# Patient Record
Sex: Female | Born: 1975 | Race: White | Hispanic: No | Marital: Married | Smoking: Never smoker
Health system: Southern US, Community
[De-identification: ages and names within clinical notes are randomized; demographics above are authoritative.]

## PROBLEM LIST (undated history)

## (undated) DIAGNOSIS — E041 Nontoxic single thyroid nodule: Secondary | ICD-10-CM

## (undated) DIAGNOSIS — E785 Hyperlipidemia, unspecified: Secondary | ICD-10-CM

## (undated) DIAGNOSIS — Z86718 Personal history of other venous thrombosis and embolism: Secondary | ICD-10-CM

## (undated) HISTORY — DX: Hyperlipidemia, unspecified: E78.5

## (undated) HISTORY — DX: Personal history of other venous thrombosis and embolism: Z86.718

## (undated) HISTORY — PX: WISDOM TOOTH EXTRACTION: SHX21

## (undated) HISTORY — DX: Nontoxic single thyroid nodule: E04.1

## (undated) HISTORY — PX: KNEE SURGERY: SHX244

---

## 2010-01-01 ENCOUNTER — Inpatient Hospital Stay (HOSPITAL_COMMUNITY): Admission: AD | Admit: 2010-01-01 | Discharge: 2010-01-03 | Payer: Self-pay | Admitting: Obstetrics and Gynecology

## 2011-03-02 LAB — CBC
MCHC: 33.6 g/dL (ref 30.0–36.0)
MCHC: 34.5 g/dL (ref 30.0–36.0)
MCV: 91.5 fL (ref 78.0–100.0)
RBC: 3.78 MIL/uL — ABNORMAL LOW (ref 3.87–5.11)
RBC: 4.32 MIL/uL (ref 3.87–5.11)
WBC: 11.1 10*3/uL — ABNORMAL HIGH (ref 4.0–10.5)

## 2011-03-02 LAB — RPR: RPR Ser Ql: NONREACTIVE

## 2011-03-02 LAB — RH IMMUNE GLOB WKUP(>/=20WKS)(NOT WOMEN'S HOSP)

## 2016-02-26 ENCOUNTER — Telehealth: Payer: Self-pay

## 2016-02-26 NOTE — Telephone Encounter (Signed)
Pre Visit call made to patient. Left message for call back.  

## 2016-02-27 ENCOUNTER — Ambulatory Visit (INDEPENDENT_AMBULATORY_CARE_PROVIDER_SITE_OTHER): Payer: BLUE CROSS/BLUE SHIELD | Admitting: Family Medicine

## 2016-02-27 ENCOUNTER — Encounter: Payer: Self-pay | Admitting: Family Medicine

## 2016-02-27 VITALS — BP 100/62 | HR 60 | Temp 98.1°F | Ht 66.0 in | Wt 131.2 lb

## 2016-02-27 DIAGNOSIS — H9193 Unspecified hearing loss, bilateral: Secondary | ICD-10-CM | POA: Diagnosis not present

## 2016-02-27 NOTE — Patient Instructions (Signed)
I will set you up to see an ENT doctor to further evaluate your ears and your hearing.   Let me know if you do not hear about your appointment

## 2016-02-27 NOTE — Progress Notes (Signed)
[facility] [place] [address], [address] [place], [place] [phone] [phone]  Date:  02/27/2016   Name:  Karen AmericanKatrina Cundy   DOB:  12/13/1976   MRN:  [id number]  PCP:  No primary care provider on file.    Chief Complaint: Ear Fullness   History of Present Illness:  Karen AmericanKatrina Ohanian is a 40 y.o. very pleasant female patient who presents with the following:  Here today as a new patient to establish care and discuss a concern- She has noted decreased hearing in both of her ears.  No buzzing or ringing.  She has a harder time with the TV; she will notice that her husband and children are watching a show and can hear, but she will not hear anything.  She thinks this has been present for 6 months perhaps. Not getting worse that she can tell.    She had swimmers ear as a child, but never did have tubes in her ears or any significant OM  She does use qtips to clean her ears.   No ear trauma She is not exposed to much loud noise, does not attend concerts or use headphones She is otherwise feeling well and generally healthy   There are no active problems to display for this patient.   Past Medical History  Diagnosis Date  . Hyperlipidemia     Past Surgical History  Procedure Laterality Date  . Knee surgery      Right  . Wisdom tooth extraction      Social History  Substance Use Topics  . Smoking status: Never Smoker   . Smokeless tobacco: Never Used  . Alcohol Use: 0.0 oz/week    0 Standard drinks or equivalent per week    Family History  Problem Relation Age of Onset  . Hyperlipidemia Mother   . Hypertension Mother   . Hypertension Father   . Hyperlipidemia Father   . Cancer Father   . Cancer Maternal Aunt   . Cancer Maternal Grandfather     No Known Allergies  Medication list has been reviewed and updated.  No current outpatient prescriptions on file prior to visit.   No current facility-administered medications  on file prior to visit.    Review of Systems:  As per HPI- otherwise negative.   Physical Examination: Filed Vitals:   02/27/16 1411  BP: 100/62  Pulse: 60  Temp: 98.1 F (36.7 C)   Filed Vitals:   02/27/16 1411  Height: 5\' 6"  (1.676 m)  Weight: 131 lb 3.2 oz (59.512 kg)   Body mass index is 21.19 kg/(m^2). Ideal Body Weight: Weight in (lb) to have BMI = 25: 154.6  GEN: WDWN, NAD, Non-toxic, A & O x 3. Slim, looks well HEENT: Atraumatic, Normocephalic. Neck supple. No masses, No LAD. Ears and Nose: No external deformity. CV: RRR, No M/G/R. No JVD. No thrill. No extra heart sounds. PULM: CTA B, no wheezes, crackles, rhonchi. No retractions. No resp. distress. No accessory muscle use. EXTR: No c/c/e NEURO Normal gait.  PSYCH: Normally interactive. Conversant. Not depressed or anxious appearing.  Calm demeanor.  Both TM and ear canals are normal with no evidence of cerumen or any other physical blockage.    Assessment and Plan: Hearing loss, bilateral - Plan: Ambulatory referral to ENT  Hearing loss for about 6 months. No obviously reversible cause such as cerumen.  Will refer to ENT for further evaluation   Signed Shanda BumpsJessica Trig Mcbryar,  MD   

## 2016-04-01 DIAGNOSIS — H903 Sensorineural hearing loss, bilateral: Secondary | ICD-10-CM | POA: Diagnosis not present

## 2016-04-01 DIAGNOSIS — H9193 Unspecified hearing loss, bilateral: Secondary | ICD-10-CM | POA: Insufficient documentation

## 2016-04-01 DIAGNOSIS — H93293 Other abnormal auditory perceptions, bilateral: Secondary | ICD-10-CM | POA: Insufficient documentation

## 2016-04-07 ENCOUNTER — Encounter: Payer: Self-pay | Admitting: Family Medicine

## 2016-04-07 DIAGNOSIS — H905 Unspecified sensorineural hearing loss: Secondary | ICD-10-CM | POA: Insufficient documentation

## 2016-08-07 ENCOUNTER — Encounter: Payer: Self-pay | Admitting: Family Medicine

## 2017-07-23 ENCOUNTER — Encounter: Payer: Self-pay | Admitting: Family Medicine

## 2017-08-02 ENCOUNTER — Telehealth: Payer: Self-pay | Admitting: Family Medicine

## 2017-08-02 NOTE — Telephone Encounter (Signed)
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  She is a bit concerned as her cholesterol numbers are elevated and had contacted me on my personal phone  Total 230 Tri 40 hdl 70 ldl 152 Ratio 3.3 Her other labs look great Estimated 10 year CV risk   Sent a message to pt; I calculated her estimated 10 year CV risk at 1.5%.  For the time being no need for medication.  Continue healthy lifestyle and follow labs every year or so

## 2017-08-07 ENCOUNTER — Encounter: Payer: Self-pay | Admitting: *Deleted

## 2017-11-16 DIAGNOSIS — Z6821 Body mass index (BMI) 21.0-21.9, adult: Secondary | ICD-10-CM | POA: Diagnosis not present

## 2017-11-16 DIAGNOSIS — Z1151 Encounter for screening for human papillomavirus (HPV): Secondary | ICD-10-CM | POA: Diagnosis not present

## 2017-11-16 DIAGNOSIS — Z01419 Encounter for gynecological examination (general) (routine) without abnormal findings: Secondary | ICD-10-CM | POA: Diagnosis not present

## 2017-11-16 DIAGNOSIS — Z1231 Encounter for screening mammogram for malignant neoplasm of breast: Secondary | ICD-10-CM | POA: Diagnosis not present

## 2018-02-12 DIAGNOSIS — D224 Melanocytic nevi of scalp and neck: Secondary | ICD-10-CM | POA: Diagnosis not present

## 2018-02-12 DIAGNOSIS — D225 Melanocytic nevi of trunk: Secondary | ICD-10-CM | POA: Diagnosis not present

## 2018-02-12 DIAGNOSIS — D2262 Melanocytic nevi of left upper limb, including shoulder: Secondary | ICD-10-CM | POA: Diagnosis not present

## 2018-02-12 DIAGNOSIS — L700 Acne vulgaris: Secondary | ICD-10-CM | POA: Diagnosis not present

## 2018-07-15 LAB — BASIC METABOLIC PANEL
Creatinine: 0.8 (ref 0.5–1.1)
POTASSIUM: 4.4 (ref 3.4–5.3)
SODIUM: 140 (ref 137–147)

## 2018-07-15 LAB — HEPATIC FUNCTION PANEL
ALT: 18 (ref 7–35)
AST: 16 (ref 13–35)
Alkaline Phosphatase: 54 (ref 25–125)
BILIRUBIN, TOTAL: 0.7

## 2018-07-15 LAB — CBC AND DIFFERENTIAL
HCT: 44 (ref 36–46)
Hemoglobin: 14.9 (ref 12.0–16.0)
PLATELETS: 256 (ref 150–399)
WBC: 4.6

## 2018-07-15 LAB — HEMOGLOBIN A1C: Hemoglobin A1C: 4.7

## 2018-07-15 LAB — TSH: TSH: 1.55 (ref 0.41–5.90)

## 2018-07-16 LAB — LIPID PANEL
Cholesterol: 216 — AB (ref 0–200)
HDL: 65 (ref 35–70)
LDL CALC: 142
Triglycerides: 44 (ref 40–160)

## 2018-07-20 ENCOUNTER — Telehealth: Payer: Self-pay | Admitting: *Deleted

## 2018-07-20 NOTE — Telephone Encounter (Signed)
Received Lab Report results from [facility]; forwarded to provider/SLS 08/06

## 2018-07-22 ENCOUNTER — Encounter: Payer: Self-pay | Admitting: Family Medicine

## 2018-07-22 LAB — LIPID PANEL WITH LDL/HDL RATIO: Cholesterol: 3.3

## 2018-07-22 LAB — CALCIUM: Calcium: 9.1

## 2018-07-22 LAB — CHLORIDE: CHLORIDE: 103

## 2019-01-28 DIAGNOSIS — L700 Acne vulgaris: Secondary | ICD-10-CM | POA: Diagnosis not present

## 2019-05-19 DIAGNOSIS — Z1231 Encounter for screening mammogram for malignant neoplasm of breast: Secondary | ICD-10-CM | POA: Diagnosis not present

## 2019-05-19 DIAGNOSIS — Z01419 Encounter for gynecological examination (general) (routine) without abnormal findings: Secondary | ICD-10-CM | POA: Diagnosis not present

## 2019-05-19 DIAGNOSIS — Z6821 Body mass index (BMI) 21.0-21.9, adult: Secondary | ICD-10-CM | POA: Diagnosis not present

## 2019-05-19 DIAGNOSIS — Z1151 Encounter for screening for human papillomavirus (HPV): Secondary | ICD-10-CM | POA: Diagnosis not present

## 2020-08-01 DIAGNOSIS — Z20822 Contact with and (suspected) exposure to covid-19: Secondary | ICD-10-CM | POA: Diagnosis not present

## 2020-08-07 DIAGNOSIS — Z1231 Encounter for screening mammogram for malignant neoplasm of breast: Secondary | ICD-10-CM | POA: Diagnosis not present

## 2020-08-07 DIAGNOSIS — Z01419 Encounter for gynecological examination (general) (routine) without abnormal findings: Secondary | ICD-10-CM | POA: Diagnosis not present

## 2020-08-07 DIAGNOSIS — Z681 Body mass index (BMI) 19 or less, adult: Secondary | ICD-10-CM | POA: Diagnosis not present

## 2020-10-03 ENCOUNTER — Other Ambulatory Visit: Payer: Self-pay | Admitting: Internal Medicine

## 2020-10-03 DIAGNOSIS — R0989 Other specified symptoms and signs involving the circulatory and respiratory systems: Secondary | ICD-10-CM

## 2020-10-12 ENCOUNTER — Ambulatory Visit
Admission: RE | Admit: 2020-10-12 | Discharge: 2020-10-12 | Disposition: A | Discharge status: Home / Self Care | Payer: BLUE CROSS/BLUE SHIELD | Source: Ambulatory Visit | Attending: Internal Medicine | Admitting: Internal Medicine

## 2020-10-12 DIAGNOSIS — R198 Other specified symptoms and signs involving the digestive system and abdomen: Secondary | ICD-10-CM

## 2020-10-12 DIAGNOSIS — R0989 Other specified symptoms and signs involving the circulatory and respiratory systems: Secondary | ICD-10-CM

## 2020-10-24 ENCOUNTER — Other Ambulatory Visit: Payer: Self-pay | Admitting: Internal Medicine

## 2020-10-24 DIAGNOSIS — E041 Nontoxic single thyroid nodule: Secondary | ICD-10-CM

## 2020-11-07 ENCOUNTER — Other Ambulatory Visit: Payer: Managed Care, Other (non HMO)

## 2020-11-13 ENCOUNTER — Ambulatory Visit
Admission: RE | Admit: 2020-11-13 | Discharge: 2020-11-13 | Disposition: A | Discharge status: Home / Self Care | Payer: Managed Care, Other (non HMO) | Source: Ambulatory Visit | Attending: Internal Medicine | Admitting: Internal Medicine

## 2020-11-13 ENCOUNTER — Other Ambulatory Visit (HOSPITAL_COMMUNITY)
Admission: RE | Admit: 2020-11-13 | Discharge: 2020-11-13 | Disposition: A | Discharge status: Home / Self Care | Payer: Managed Care, Other (non HMO) | Source: Ambulatory Visit | Attending: Radiology | Admitting: Radiology

## 2020-11-13 DIAGNOSIS — E041 Nontoxic single thyroid nodule: Secondary | ICD-10-CM

## 2020-11-15 LAB — CYTOLOGY - NON PAP

## 2020-11-27 ENCOUNTER — Encounter (HOSPITAL_COMMUNITY): Payer: Self-pay

## 2021-10-14 ENCOUNTER — Other Ambulatory Visit: Payer: Self-pay | Admitting: Internal Medicine

## 2021-10-14 DIAGNOSIS — E041 Nontoxic single thyroid nodule: Secondary | ICD-10-CM

## 2021-10-31 ENCOUNTER — Ambulatory Visit
Admission: RE | Admit: 2021-10-31 | Discharge: 2021-10-31 | Disposition: A | Discharge status: Home / Self Care | Payer: 59 | Source: Ambulatory Visit | Attending: Internal Medicine | Admitting: Internal Medicine

## 2021-10-31 DIAGNOSIS — E041 Nontoxic single thyroid nodule: Secondary | ICD-10-CM

## 2022-01-30 DIAGNOSIS — Z23 Encounter for immunization: Secondary | ICD-10-CM | POA: Diagnosis not present

## 2022-02-11 DIAGNOSIS — F4320 Adjustment disorder, unspecified: Secondary | ICD-10-CM | POA: Diagnosis not present

## 2022-06-13 IMAGING — US US THYROID
1 series · 13 of 25 positions shown · non-contrast
Comparison: None.

CLINICAL DATA: Other.  Globus sensation for 10 months.

EXAM:
THYROID ULTRASOUND
TECHNIQUE: Ultrasound examination of the thyroid gland and adjacent soft
tissues was performed.

[Series 1: us thyroid · 0.07mm/px · 13 of 33 slices shown]
[im 1/33]
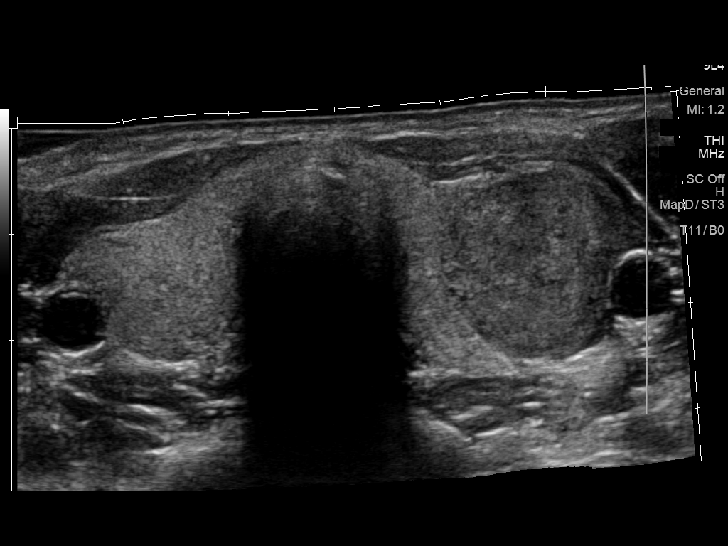
[im 3/33]
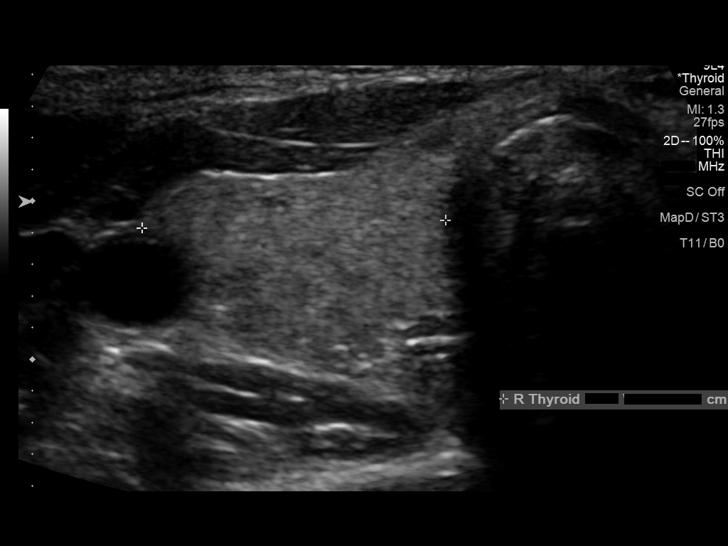
[im 6/33]
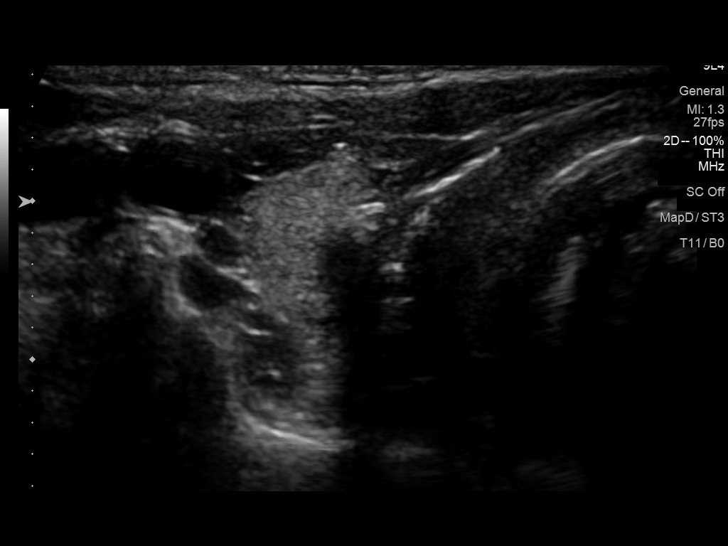
[im 9/33]
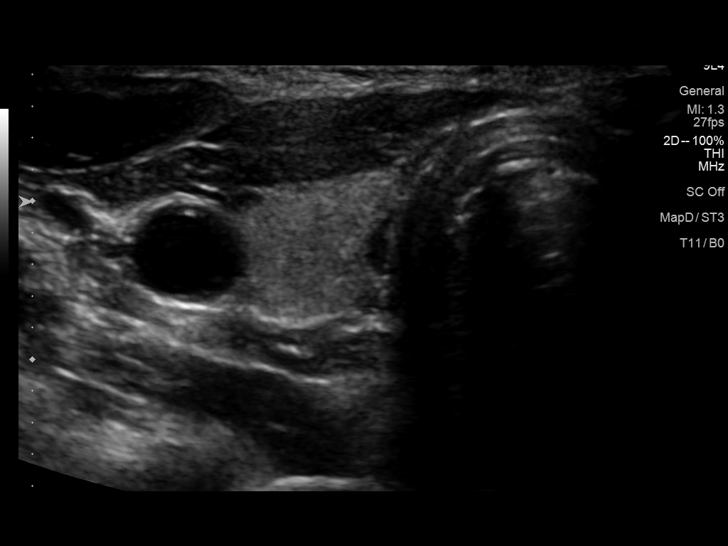
[im 11/33]
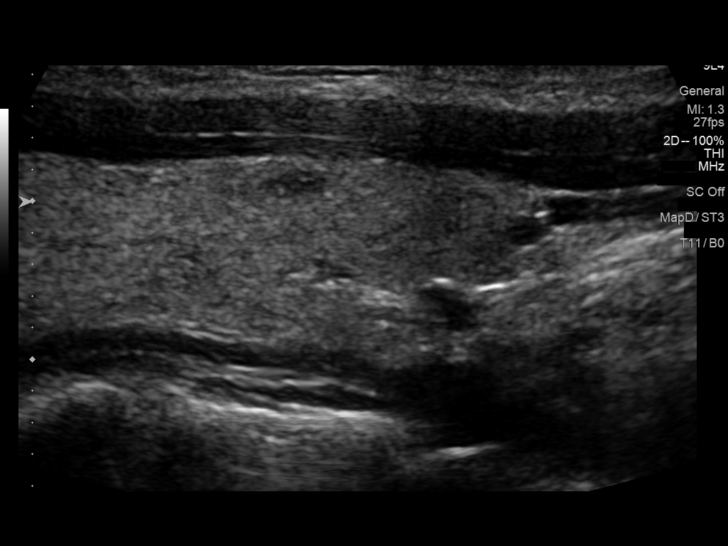
[im 14/33]
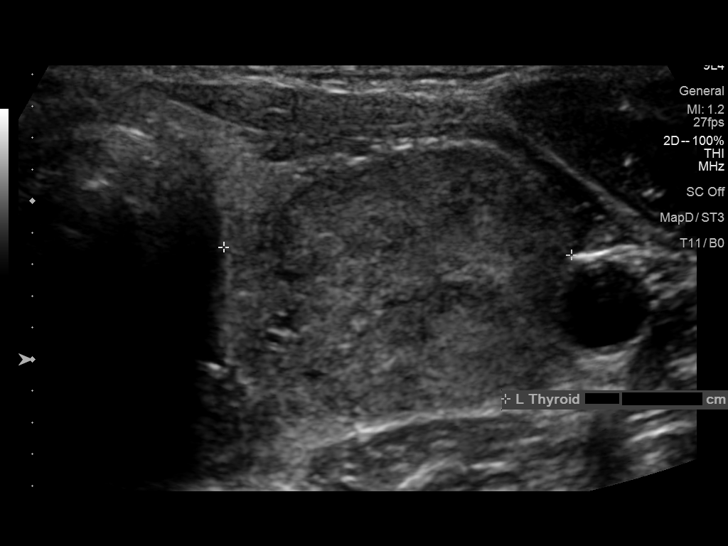
[im 17/33]
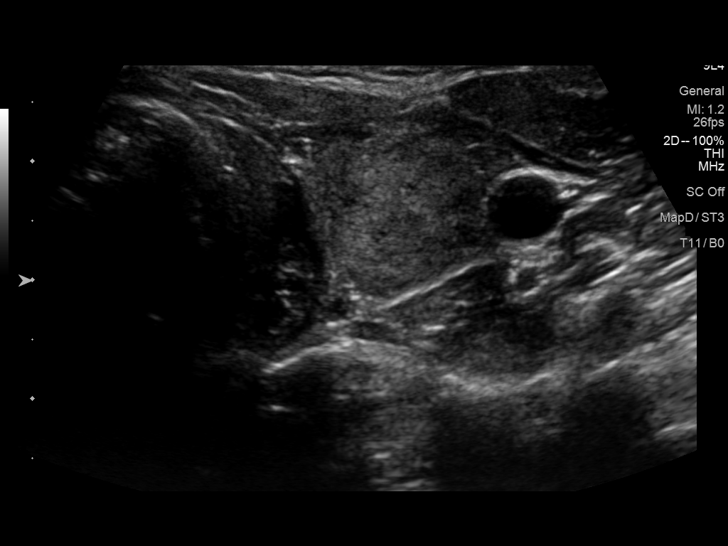
[im 19/33]
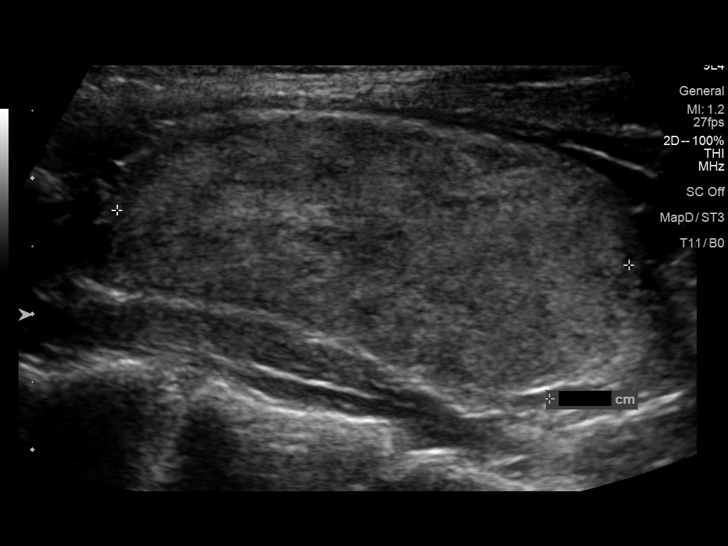
[im 22/33]
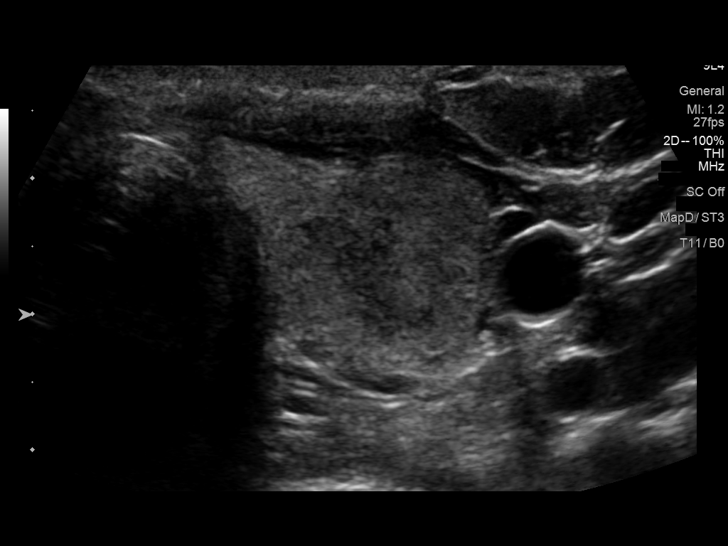
[im 25/33]
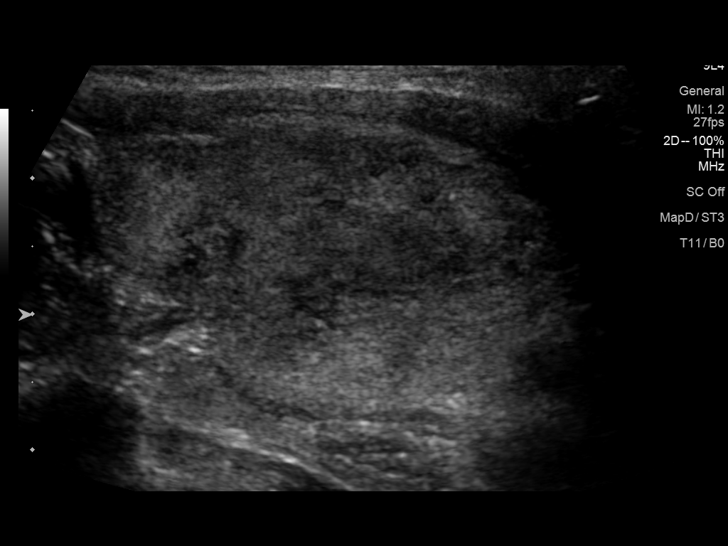
[im 27/33]
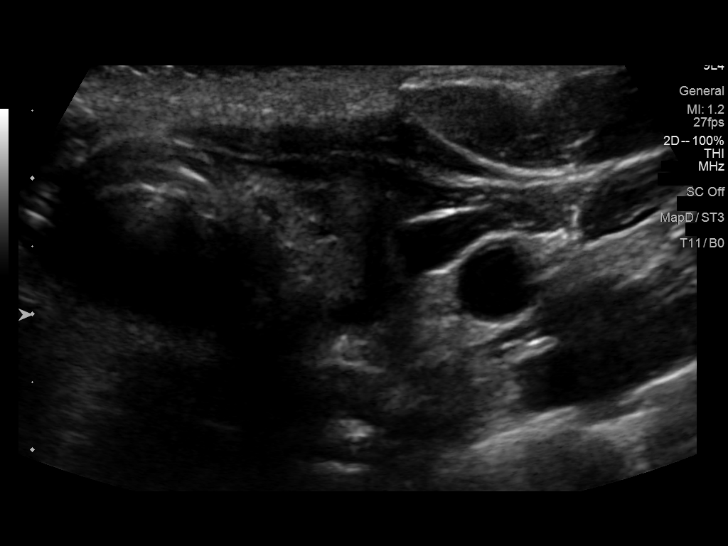
[im 30/33]
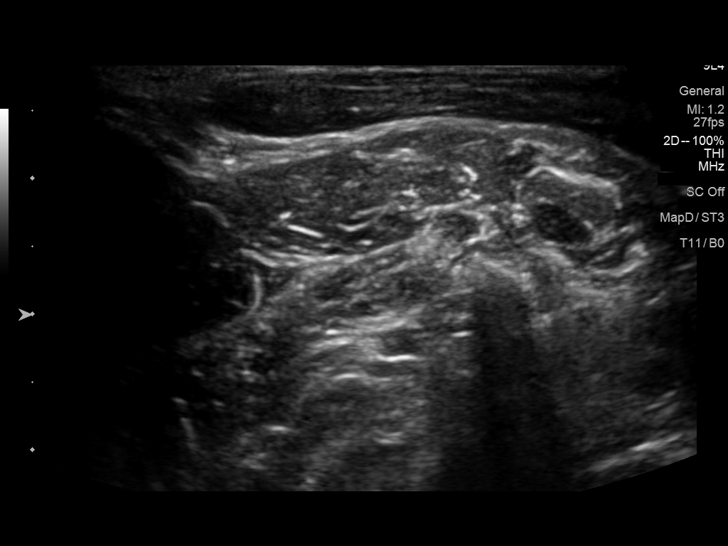
[im 33/33]
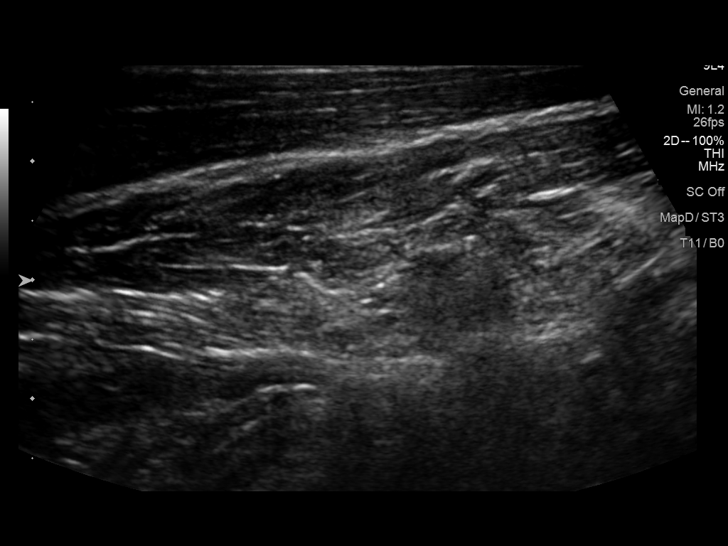

[13 of 25 positions shown; findings below may reference images not displayed]

FINDINGS: Parenchymal Echotexture: Mildly heterogenous

Isthmus: 0.2 cm

Right lobe: 4.9 x 1.0 x 1.9 cm

Left lobe: 4.5 x 2.0 x 2.2 cm

_________________________________________________________

Estimated total number of nodules >/= 1 cm: 1

Number of spongiform nodules >/=  2 cm not described below (TR1): 0

Number of mixed cystic and solid nodules >/= 1.5 cm not described
below (TR2): 0

_________________________________________________________

Nodule # 1:

Location: Left; Mid

Maximum size: 3.8 cm; Other 2 dimensions: 2.1 x 1.7 cm

Composition: solid/almost completely solid (2)

Echogenicity: isoechoic (1)

Shape: not taller-than-wide (0)

Margins: smooth (0)

Echogenic foci: none (0)

ACR TI-RADS total points: 3.

ACR TI-RADS risk category: TR3 (3 points).

ACR TI-RADS recommendations:

**Given size (>/= 2.5 cm) and appearance, fine needle aspiration of
this mildly suspicious nodule should be considered based on TI-RADS
criteria.

_________________________________________________________
IMPRESSION: Solid left thyroid nodule encompassing the majority of the left lobe
of the thyroid gland. This nodule is mildly suspicious for
malignancy and meets criteria tissue sampling (TI-RADS 3). Recommend
ultrasound-guided fine-needle aspiration.

The above is in keeping with the ACR TI-RADS recommendations - [HOSPITAL] 0906;[DATE].

## 2022-07-15 IMAGING — US US FNA BIOPSY THYROID 1ST LESION
1 series · 13 of 20 positions shown · non-contrast
Comparison: Thyroid ultrasound dated 10/12/2020

INDICATION: Patient with history of thyroid ultrasound on 10/12/2020 which
revealed a 3.8 cm left mid nodule which meets criteria for biopsy.
She presents today for the procedure.

EXAM:
ULTRASOUND GUIDED FINE NEEDLE ASPIRATION BIOPSY OF LEFT MID THYROID
NODULE
TECHNIQUE: Informed written consent was obtained from the patient after a
discussion of the risks, benefits and alternatives to treatment.
Questions regarding the procedure were encouraged and answered. A
timeout was performed prior to the initiation of the procedure.

[Series 1: us fna biopsy thyroid 1st lesion · 0.06mm/px · 20 acquisitions, 13 frames shown]
[im 1/20]
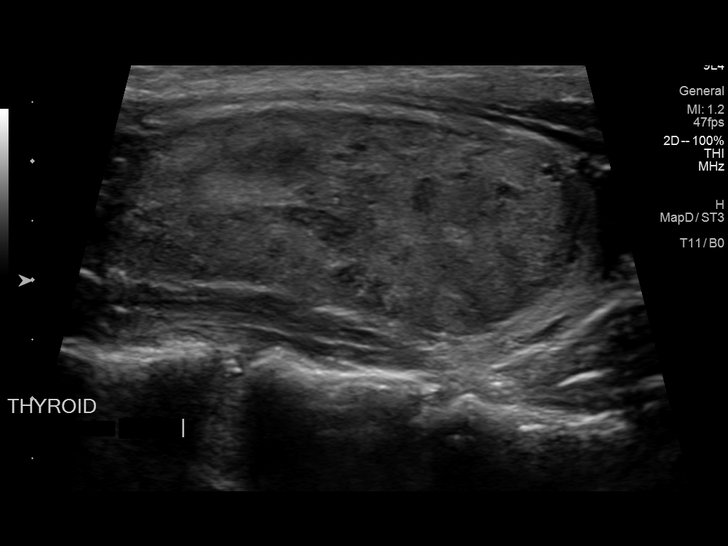
[im 3/20]
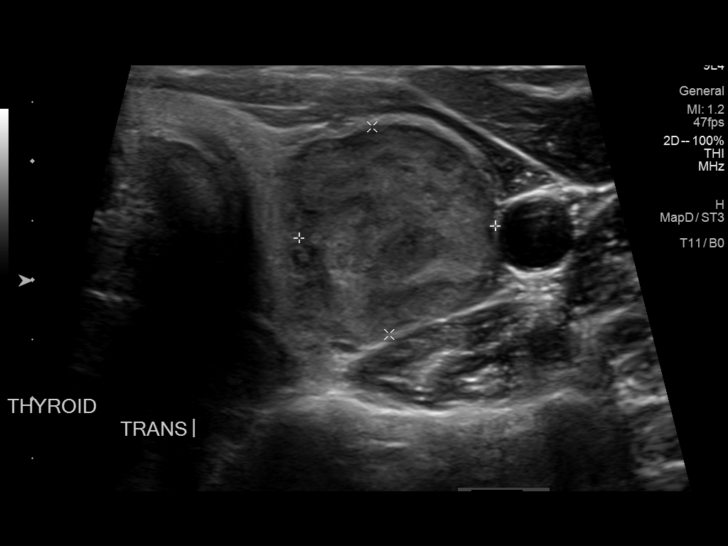
[im 4/20]
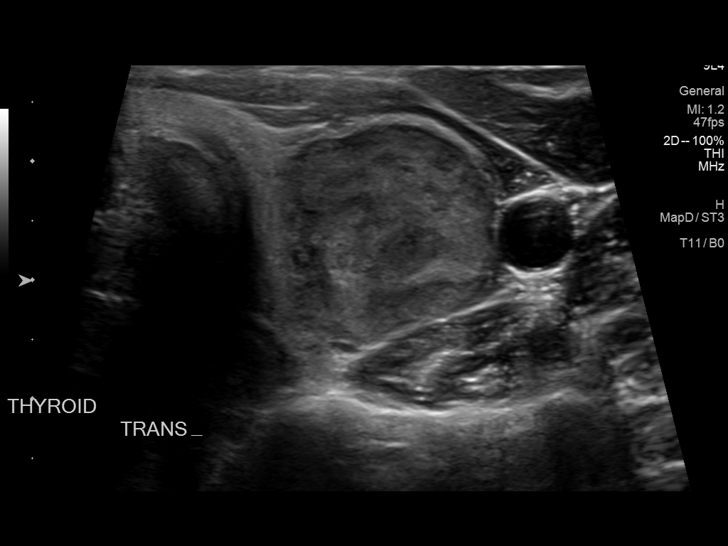
[im 6/20]
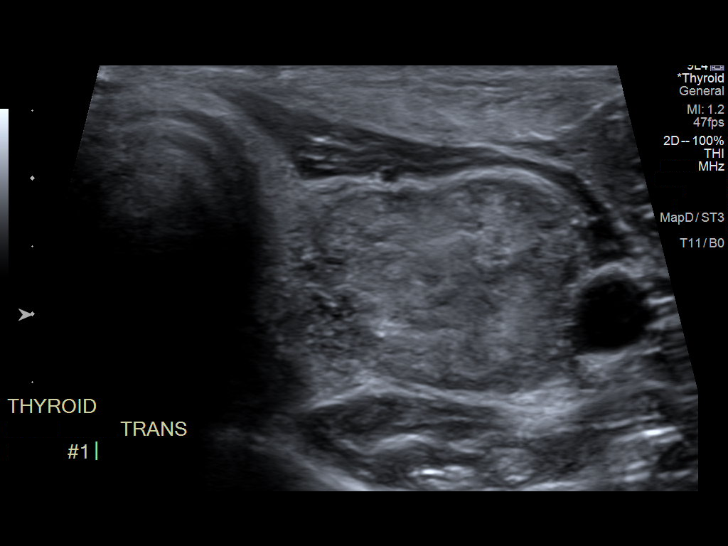
[im 7/20]
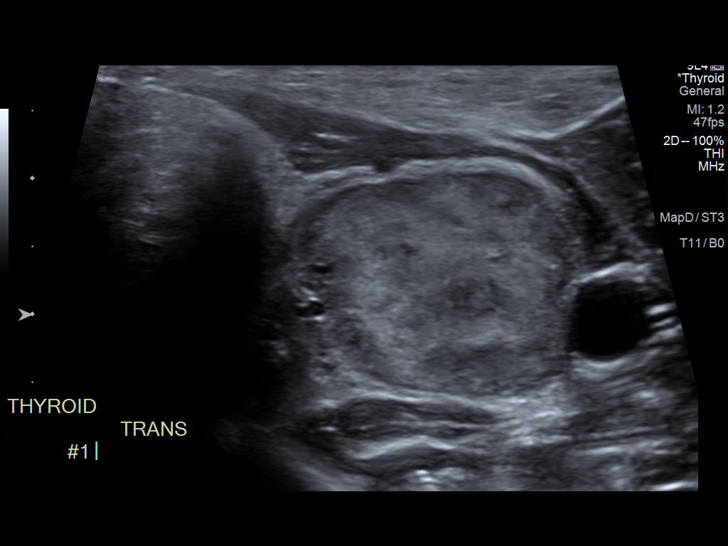
[im 9/20]
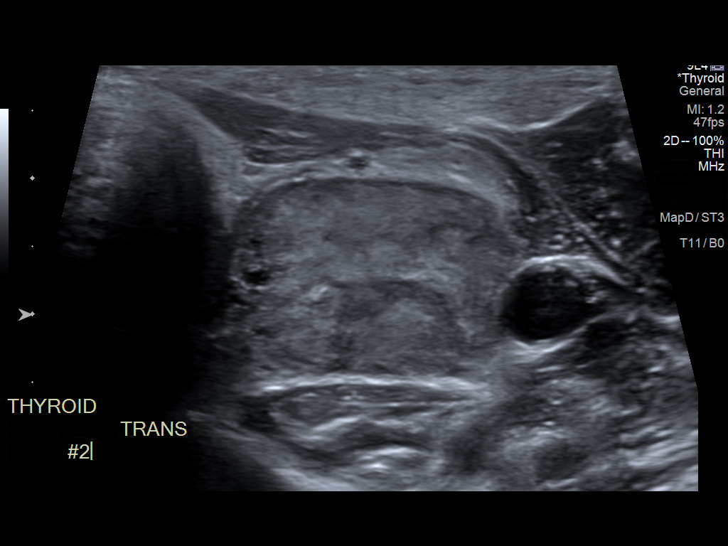
[im 11/20]
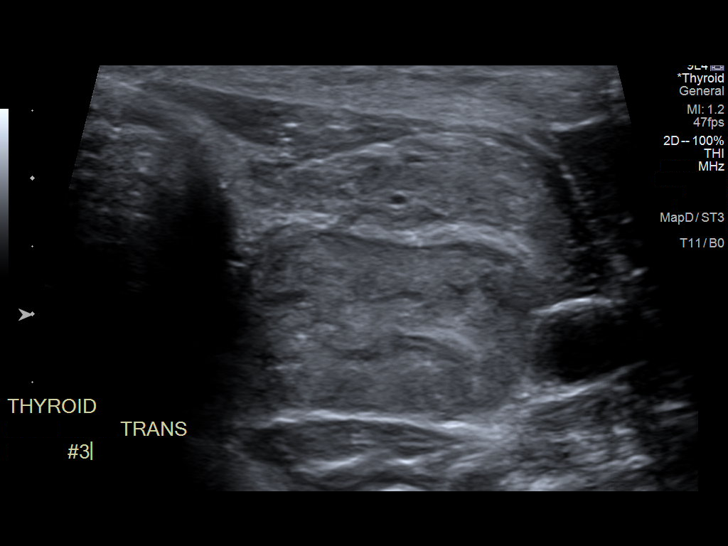
[im 12/20]
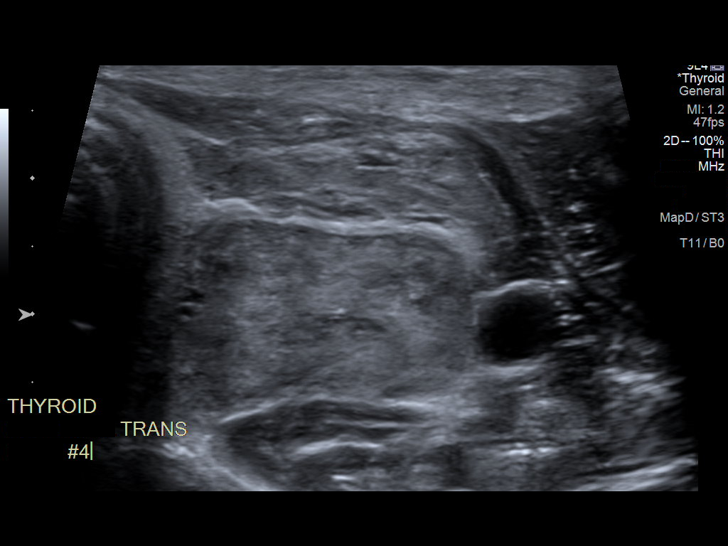
[im 14/20]
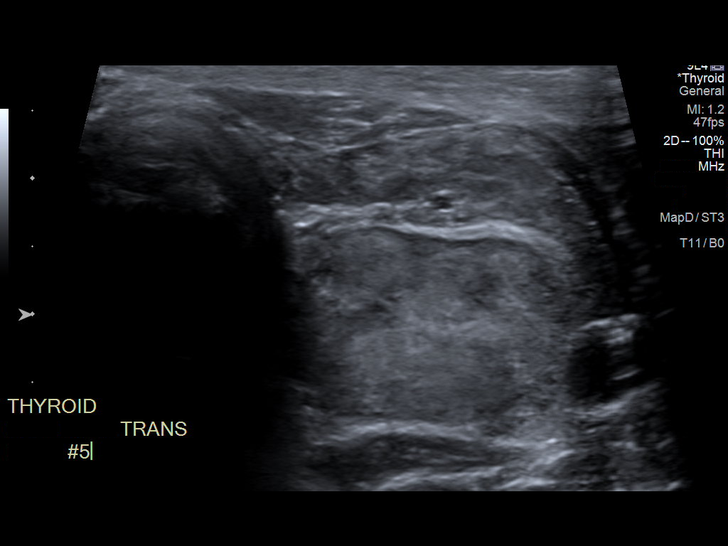
[im 15/20]
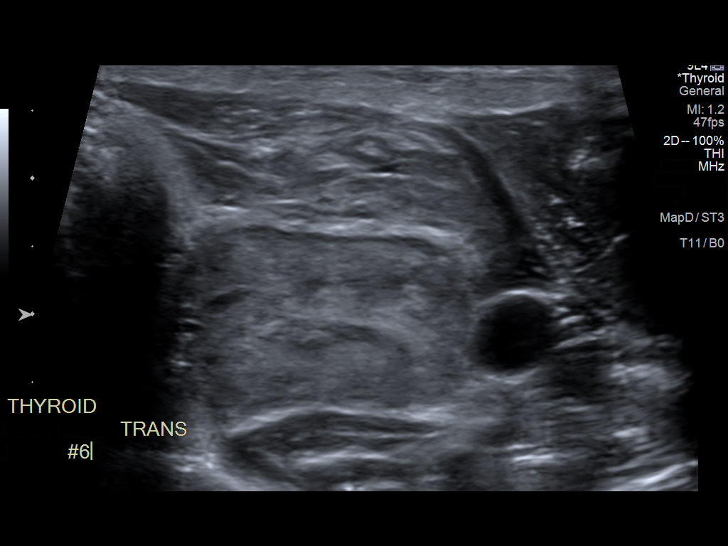
[im 17/20]
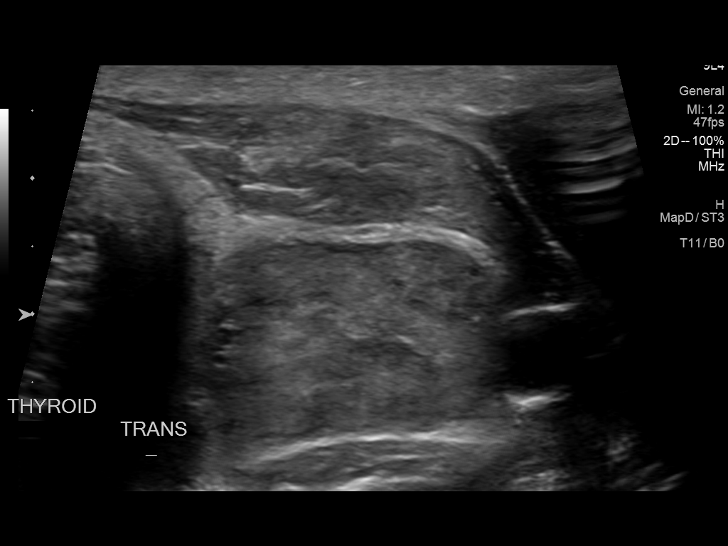
[im 18/20]
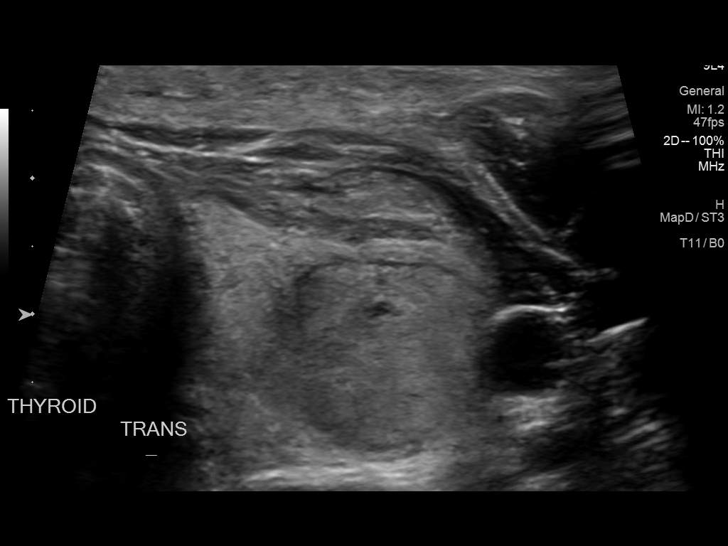
[im 20/20]
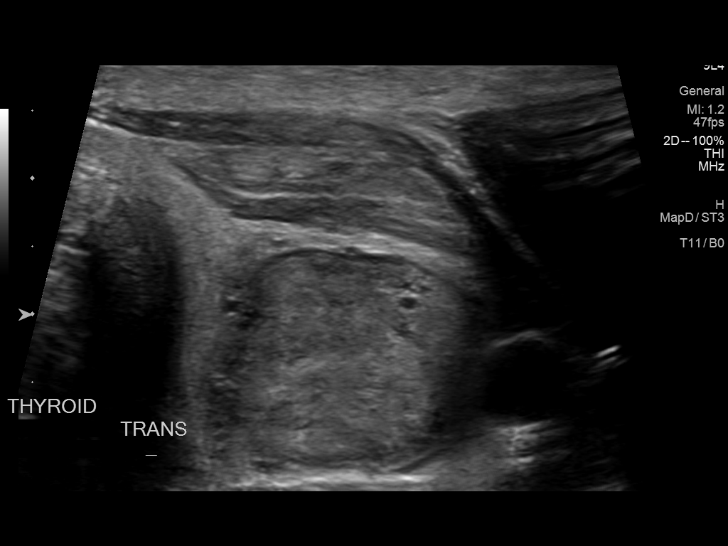

[13 of 20 positions shown; findings below may reference images not displayed]

MEDICATIONS:
1% lidocaine to skin and subcutaneous tissue

COMPLICATIONS:
None immediate.
Pre-procedural ultrasound scanning demonstrated unchanged size and
appearance of the indeterminate nodule within the left mid thyroid

The procedure was planned. The neck was prepped in the usual sterile
fashion, and a sterile drape was applied covering the operative
field. A timeout was performed prior to the initiation of the
procedure. Local anesthesia was provided with 1% lidocaine.

Under direct ultrasound guidance, 6 FNA biopsies were performed of
the left mid thyroid nodule with 25 gauge needles. Multiple
ultrasound images were saved for procedural documentation purposes.
The samples were prepared and submitted to pathology as well as for
Afirma testing.

Limited post procedural scanning was negative for hematoma or
additional complication. Dressings were placed. The patient
tolerated the above procedures procedure well without immediate
postprocedural complication.
FINDINGS: Nodule reference number based on prior diagnostic ultrasound: 1

Maximum size: 3.8 cm

Location: Left; Mid

ACR TI-RADS risk category: TR3 (3 points)

Reason for biopsy: meets ACR TI-RADS criteria

Ultrasound imaging confirms appropriate placement of the needles
within the thyroid nodule.
IMPRESSION: Technically successful ultrasound guided fine needle aspiration
biopsy of left mid thyroid nodule. Final pathology pending.

## 2022-09-22 DIAGNOSIS — M25561 Pain in right knee: Secondary | ICD-10-CM | POA: Diagnosis not present

## 2022-09-30 DIAGNOSIS — M25561 Pain in right knee: Secondary | ICD-10-CM | POA: Diagnosis not present

## 2022-10-03 DIAGNOSIS — M25561 Pain in right knee: Secondary | ICD-10-CM | POA: Diagnosis not present

## 2022-10-16 DIAGNOSIS — S83211A Bucket-handle tear of medial meniscus, current injury, right knee, initial encounter: Secondary | ICD-10-CM | POA: Diagnosis not present

## 2022-10-16 DIAGNOSIS — G8918 Other acute postprocedural pain: Secondary | ICD-10-CM | POA: Diagnosis not present

## 2022-10-16 DIAGNOSIS — M1711 Unilateral primary osteoarthritis, right knee: Secondary | ICD-10-CM | POA: Diagnosis not present

## 2022-10-16 DIAGNOSIS — Y999 Unspecified external cause status: Secondary | ICD-10-CM | POA: Diagnosis not present

## 2022-10-16 DIAGNOSIS — X58XXXA Exposure to other specified factors, initial encounter: Secondary | ICD-10-CM | POA: Diagnosis not present

## 2022-10-16 DIAGNOSIS — S83231A Complex tear of medial meniscus, current injury, right knee, initial encounter: Secondary | ICD-10-CM | POA: Diagnosis not present

## 2022-10-16 DIAGNOSIS — S83251A Bucket-handle tear of lateral meniscus, current injury, right knee, initial encounter: Secondary | ICD-10-CM | POA: Diagnosis not present

## 2022-10-22 ENCOUNTER — Other Ambulatory Visit (HOSPITAL_COMMUNITY): Payer: Self-pay

## 2022-10-22 ENCOUNTER — Ambulatory Visit (HOSPITAL_COMMUNITY)
Admission: RE | Admit: 2022-10-22 | Discharge: 2022-10-22 | Disposition: A | Discharge status: Home / Self Care | Payer: BC Managed Care – PPO | Source: Ambulatory Visit | Attending: Vascular Surgery | Admitting: Vascular Surgery

## 2022-10-22 ENCOUNTER — Other Ambulatory Visit (HOSPITAL_COMMUNITY): Payer: Self-pay | Admitting: Orthopaedic Surgery

## 2022-10-22 DIAGNOSIS — M6281 Muscle weakness (generalized): Secondary | ICD-10-CM | POA: Diagnosis not present

## 2022-10-22 DIAGNOSIS — R609 Edema, unspecified: Secondary | ICD-10-CM

## 2022-10-22 DIAGNOSIS — I82441 Acute embolism and thrombosis of right tibial vein: Secondary | ICD-10-CM

## 2022-10-22 DIAGNOSIS — M25661 Stiffness of right knee, not elsewhere classified: Secondary | ICD-10-CM | POA: Diagnosis not present

## 2022-10-22 NOTE — Progress Notes (Signed)
Right lower extremity venous duplex completed. Refer to "CV Proc" under chart review to view preliminary results.  Preliminary results discussed with Eber Jones of Dr. Nolon Nations office. Staff message sent to Carlyn Reichert, RN to refer patient to DVT clinic.  10/22/2022 4:06 PM Eula Fried., MHA, RVT, RDCS, RDMS

## 2022-10-23 ENCOUNTER — Ambulatory Visit (HOSPITAL_COMMUNITY)
Admission: RE | Admit: 2022-10-23 | Discharge: 2022-10-23 | Disposition: A | Discharge status: Home / Self Care | Payer: BC Managed Care – PPO | Source: Ambulatory Visit | Attending: Vascular Surgery | Admitting: Vascular Surgery

## 2022-10-23 ENCOUNTER — Other Ambulatory Visit (HOSPITAL_COMMUNITY): Payer: Self-pay

## 2022-10-23 VITALS — BP 113/71 | HR 96

## 2022-10-23 DIAGNOSIS — I82441 Acute embolism and thrombosis of right tibial vein: Secondary | ICD-10-CM | POA: Insufficient documentation

## 2022-10-23 MED ORDER — APIXABAN (ELIQUIS) VTE STARTER PACK (10MG AND 5MG)
ORAL_TABLET | ORAL | 0 refills | Status: DC
  Filled 2022-10-23: qty 74, 30d supply, fill #0

## 2022-10-23 MED ORDER — APIXABAN 5 MG PO TABS: 5.0000 mg | ORAL_TABLET | Freq: Two times a day (BID) | ORAL | 1 refills | Status: DC

## 2022-10-23 NOTE — Patient Instructions (Signed)
-  Start apixaban (Eliquis) 10 mg twice daily for 7 days followed by 5 mg twice daily. -Your refills have been sent to your [facility]. You'll need to call them in 1 month as you start to run out of your current supply to ask them to fill this.  -Thigh high compression stockings: 7 inch ankle, 13 inch calf, 19 inch thigh. 20-30 mmHg compression. Wear compression stockings daily, removing at night. -It is important to take your medication around the same time every day.  -Avoid NSAIDs like ibuprofen (Advil, Motrin) and naproxen (Aleve) as well as aspirin doses over 100 mg daily. -Tylenol (acetaminophen) is the preferred over the counter pain medication to lower the risk of bleeding. -Be sure to alert all of your health care providers that you are taking an anticoagulant prior to starting a new medication or having a procedure. -Monitor for signs and symptoms of bleeding (abnormal bruising, prolonged bleeding, nose bleeds, bleeding from gums, discolored urine, black tarry stools). If you have fallen and hit your head OR if your bleeding is severe or not stopping, seek emergency care.  -Go to the emergency room if emergent signs and symptoms of new clot occur (new or worse swelling and pain in an arm or leg, shortness of breath, chest pain, fast or irregular heartbeats, lightheadedness, dizziness, fainting, coughing up blood) or if you experience a significant color change (pale or blue) in the extremity that has the DVT.   Your next visit is on 11/25/22 at 10am.  [facility] [address] [place], [place], [place] [address] Enter the hospital through Entrance C and pull up to the [facility] entrance to the free valet parking.  Check in for your appointment at the [facility].   If you have any questions or need to reschedule an appointment, please call [phone].  If you are having an emergency, call 911 or present to the nearest emergency room.   What  is a DVT?  -Deep vein thrombosis (DVT) is a condition in which a blood clot forms in a vein of the deep venous system which can occur in the lower leg, thigh, pelvis, arm, or neck. This condition is serious and can be life-threatening if the clot travels to the arteries of the lungs and causing a blockage (pulmonary embolism, PE). A DVT can also damage veins in the leg, which can lead to long-term venous disease, leg pain, swelling, discoloration, and ulcers or sores (post-thrombotic syndrome).  -Treatment may include taking an anticoagulant medication to prevent more clots from forming and the current clot from growing, wearing compression stockings, and/or surgical procedures to remove or dissolve the clot.

## 2022-10-23 NOTE — Progress Notes (Signed)
DVT Clinic Note  Name: Karen Leon     MRN: [id number]     DOB: February 02, 1976     Sex: female  PCP: Melida Quitter, MD  Today's Visit: Visit Information: Initial Visit  Referred to DVT Clinic by: Dr. Jerl Santos (ortho) Referred to CPP by: Dr. Karin Lieu Reason for referral:  Chief Complaint  Patient presents with   DVT   HISTORY OF PRESENT ILLNESS:  Karen Leon is a 46 y.o. female who presents after diagnosis of DVT for medication management. She had a right arthroscopic knee surgery last Thursday and had her follow up visit with ortho yesterday where she reported having pain in her right calf. She has been mobile since the procedure. They ordered a venous US and she was diagnosed with an acute DVT. They sent in one dose of Eliquis for her to take last night and scheduled her in the DVT Clinic today.   Positive Thrombotic Risk Factors: Recent surgery (within 3 months) Bleeding Risk Factors: None Present  Rx Insurance Coverage: Commercial Rx Affordability: Eliquis is a $0 copay Preferred Pharmacy: Filled starter pack today at [facility]. Refills sent to her [facility] pharmacy.   Past Medical History:  Diagnosis Date   Hyperlipidemia      Social History   Socioeconomic History   Marital status: Married    Spouse name: Not on file   Number of children: Not on file   Years of education: Not on file   Highest education level: Not on file  Occupational History   Not on file  Tobacco Use   Smoking status: Never   Smokeless tobacco: Never  Substance and Sexual Activity   Alcohol use: Yes    Alcohol/week: 0.0 standard drinks of alcohol   Drug use: No   Sexual activity: Yes    Partners: Male    Comment: IUD/Vasectomy  Other Topics Concern   Not on file  Social History Narrative   Not on file   Social Determinants of Health   Financial Resource Strain: Not on file  Food Insecurity: Not on file  Transportation Needs: Not on file  Physical Activity: Not on file   Stress: Not on file  Social Connections: Not on file  Intimate Partner Violence: Not on file    Family History  Problem Relation Age of Onset   Hyperlipidemia Mother    Hypertension Mother    Hypertension Father    Hyperlipidemia Father    Cancer Father    Cancer Maternal Aunt    Cancer Maternal Grandfather     Allergies as of 10/23/2022   (No Known Allergies)    No current outpatient medications on file prior to encounter.   No current facility-administered medications on file prior to encounter.   REVIEW OF SYSTEMS:  Review of Systems  Musculoskeletal:        Right calf pain and heaviness   PHYSICAL EXAMINATION:  Vitals:   10/23/22 1031  BP: 113/71  Pulse: 96  SpO2: 100%    Physical Exam Vitals reviewed.  Cardiovascular:     Rate and Rhythm: Normal rate.  Pulmonary:     Effort: Pulmonary effort is normal.   Villalta Score for Post-Thrombotic Syndrome: Pain: Moderate Cramps: Absent Heaviness: Moderate Paresthesia: Absent Pruritus: Absent Pretibial Edema: Absent Skin Induration: Absent Hyperpigmentation: Absent Redness: Mild Venous Ectasia: Mild Pain on calf compression: Absent Villalta Preliminary Score: 6 Is venous ulcer present?: No If venous ulcer is present and score is <15, then 15 points  total are assigned: Absent Villalta Total Score: 6  LABS:  CBC     Component Value Date/Time   WBC 4.6 07/15/2018 0000   WBC 13.6 (H) 01/02/2010 0505   RBC 3.78 (L) 01/02/2010 0505   HGB 14.9 07/15/2018 0000   HCT 44 07/15/2018 0000   PLT 256 07/15/2018 0000   MCV 91.5 01/02/2010 0505   MCHC 34.5 01/02/2010 0505   RDW 12.7 01/02/2010 0505    Hepatic Function      Component Value Date/Time   AST 16 07/15/2018 0000   ALT 18 07/15/2018 0000   ALKPHOS 54 07/15/2018 0000    Renal Function   Lab Results  Component Value Date   CREATININE 0.8 07/15/2018    CrCl cannot be calculated (Patient's most recent lab result is older than the maximum 21 days  allowed.).   [facility] Vascular Lab Studies:  10/22/22 VAS [place] LOWER EXTREMITY VENOUS (DVT)RIGHT  Summary:  RIGHT:  - Findings consistent with acute deep vein thrombosis involving the right  posterior tibial veins.  - Findings consistent with acute intramuscular deep vein thrombosis  involving the soleal vein.  - Findings consistent with acute superficial vein thrombosis involving the  right great saphenous vein at the mid calf.  - No cystic structure found in the popliteal fossa.   ASSESSMENT: Location of DVT: Left distal vein, Left superficial vein Cause of DVT: provoked by a transient risk factor  Patient's most recent labs are 45 year old (no concerning abnormalities), but she has a lab appointment in a couple days at her annual PCP visit. Given no concern for abnormal labs, will wait to use these as her baseline labs.   PLAN: -Start apixaban (Eliquis) 10 mg twice daily for 7 days followed by 5 mg twice daily. -Expected duration of therapy: 3 months. Therapy started on 10/23/22. -Patient educated on purpose, proper use and potential adverse effects of apixaban (Eliquis). -Discussed importance of taking medication around the same time every day. -Advised patient of medications to avoid (NSAIDs, aspirin doses >100 mg daily). -Educated that Tylenol (acetaminophen) is the preferred analgesic to lower the risk of bleeding. -Advised patient to alert all providers of anticoagulation therapy prior to starting a new medication or having a procedure. -Emphasized importance of monitoring for signs and symptoms of bleeding (abnormal bruising, prolonged bleeding, nose bleeds, bleeding from gums, discolored urine, black tarry stools). -Educated patient to present to the ED if emergent signs and symptoms of new thrombosis occur. -Measured patient for compression stockings. -Counseled patient to wear compression stockings daily, removing at night.  Follow up: 1 month in DVT Clinic  Pervis Hocking,  PharmD, CPP Deep Vein Thrombosis Clinic Clinical Pharmacist Practitioner Office: [phone] 10/23/2022 11:36 AM

## 2022-10-27 NOTE — Addendum Note (Signed)
Encounter addended by: Dicie Beam, RPH-CPP on: 10/27/2022 2:02 PM  Actions taken: Order Reconciliation Section accessed

## 2022-10-28 ENCOUNTER — Other Ambulatory Visit: Payer: Self-pay | Admitting: Internal Medicine

## 2022-10-28 DIAGNOSIS — E041 Nontoxic single thyroid nodule: Secondary | ICD-10-CM

## 2022-10-28 DIAGNOSIS — Z1339 Encounter for screening examination for other mental health and behavioral disorders: Secondary | ICD-10-CM | POA: Diagnosis not present

## 2022-10-28 DIAGNOSIS — Z0000 Encounter for general adult medical examination without abnormal findings: Secondary | ICD-10-CM | POA: Diagnosis not present

## 2022-10-28 DIAGNOSIS — E785 Hyperlipidemia, unspecified: Secondary | ICD-10-CM | POA: Diagnosis not present

## 2022-10-28 DIAGNOSIS — Z1331 Encounter for screening for depression: Secondary | ICD-10-CM | POA: Diagnosis not present

## 2022-10-30 DIAGNOSIS — Z1231 Encounter for screening mammogram for malignant neoplasm of breast: Secondary | ICD-10-CM | POA: Diagnosis not present

## 2022-11-12 ENCOUNTER — Ambulatory Visit
Admission: RE | Admit: 2022-11-12 | Discharge: 2022-11-12 | Disposition: A | Discharge status: Home / Self Care | Payer: BC Managed Care – PPO | Source: Ambulatory Visit | Attending: Internal Medicine | Admitting: Internal Medicine

## 2022-11-12 DIAGNOSIS — E042 Nontoxic multinodular goiter: Secondary | ICD-10-CM | POA: Diagnosis not present

## 2022-11-12 DIAGNOSIS — E041 Nontoxic single thyroid nodule: Secondary | ICD-10-CM

## 2022-11-25 ENCOUNTER — Ambulatory Visit (HOSPITAL_COMMUNITY)
Admission: RE | Admit: 2022-11-25 | Discharge: 2022-11-25 | Disposition: A | Discharge status: Home / Self Care | Payer: BC Managed Care – PPO | Source: Ambulatory Visit | Attending: Student-PharmD | Admitting: Student-PharmD

## 2022-11-25 VITALS — BP 109/78 | HR 73

## 2022-11-25 DIAGNOSIS — I82441 Acute embolism and thrombosis of right tibial vein: Secondary | ICD-10-CM

## 2022-11-25 NOTE — Progress Notes (Signed)
DVT Clinic Note  Name: Karen Leon     MRN: [id number]     DOB: [birth year]     Sex: female  PCP: Michael Boston, MD  Today's Visit: Visit Information: Follow Up Visit  Referred to DVT Clinic by: Dr. Rhona Raider (ortho)  Referred to CPP by: Dr. Trula Slade Reason for referral:  Chief Complaint  Patient presents with   Med Management - DVT   HISTORY OF PRESENT ILLNESS:  Karen Leon is a 46 y.o. female who presents after diagnosis of DVT for medication management. She reports that she experienced pain and cramping in her calf for about 3 weeks following her last visit but then it resolved. Reports no pain or problems today. Denies abnormal bleeding or bruising. Denies missed doses of Eliquis. She is wearing compression stockings and elevating her leg daily.   Positive Thrombotic Risk Factors: Recent surgery (within 3 months) Bleeding Risk Factors: None Present  Negative Thrombotic Risk Factors: Previous VTE, Recent trauma (within 3 months), Recent admission to hospital with acute illness (within 3 months), Paralysis, paresis, or recent plaster cast immobilization of lower extremity, Central venous catheterization, Sedentary journey lasting >8 hours within 4 weeks, Pregnancy, Bed rest >72 hours within 3 months, Within 6 weeks postpartum, Recent cesarean section (within 3 months), Estrogen therapy, Testosterone therapy, Active cancer, Smoking, Obesity, Older age, Known thrombophilic condition, Recent COVID diagnosis (within 3 months), Erythropoiesis-stimulating agent, Non-malignant, chronic inflammatory condition  Rx Insurance Coverage: Commercial Rx Affordability: Eliquis copay is $0. Provided her with $10 copay card in the event her copay is higher in the new year.  Preferred Pharmacy: Refills have been sent to her [facility] on [address].   Past Medical History:  Diagnosis Date   Hyperlipidemia      Social History   Socioeconomic History   Marital status: Married    Spouse name: Not  on file   Number of children: Not on file   Years of education: Not on file   Highest education level: Not on file  Occupational History   Not on file  Tobacco Use   Smoking status: Never   Smokeless tobacco: Never  Substance and Sexual Activity   Alcohol use: Yes    Alcohol/week: 0.0 standard drinks of alcohol   Drug use: No   Sexual activity: Yes    Partners: Male    Comment: IUD/Vasectomy  Other Topics Concern   Not on file  Social History Narrative   Not on file   Social Determinants of Health   Financial Resource Strain: Not on file  Food Insecurity: Not on file  Transportation Needs: Not on file  Physical Activity: Not on file  Stress: Not on file  Social Connections: Not on file  Intimate Partner Violence: Not on file    Family History  Problem Relation Age of Onset   Hyperlipidemia Mother    Hypertension Mother    Hypertension Father    Hyperlipidemia Father    Cancer Father    Cancer Maternal Aunt    Cancer Maternal Grandfather     Allergies as of 11/25/2022   (No Known Allergies)    Current Outpatient Medications on File Prior to Encounter  Medication Sig Dispense Refill   apixaban (ELIQUIS) 5 MG TABS tablet Take 1 tablet (5 mg total) by mouth 2 (two) times daily. Start taking after completion of starter pack. 60 tablet 1   No current facility-administered medications on file prior to encounter.   REVIEW OF SYSTEMS:  Review of  Systems  Respiratory:  Negative for shortness of breath.   Cardiovascular:  Negative for chest pain, palpitations and leg swelling.  Musculoskeletal:  Negative for myalgias.  Neurological:  Negative for dizziness and tingling.   PHYSICAL EXAMINATION:  Vitals:   11/25/22 1009  BP: 109/78  Pulse: 73  SpO2: 100%   Physical Exam Vitals reviewed.  Cardiovascular:     Rate and Rhythm: Normal rate.  Pulmonary:     Effort: Pulmonary effort is normal.  Musculoskeletal:        General: No swelling.   Villalta Score for  Post-Thrombotic Syndrome: Pain: Absent Cramps: Absent Heaviness: Absent Paresthesia: Absent Pruritus: Absent Pretibial Edema: Absent Skin Induration: Absent Hyperpigmentation: Absent Redness: Absent Venous Ectasia: Absent Pain on calf compression: Absent Villalta Preliminary Score: 0 Is venous ulcer present?: No If venous ulcer is present and score is <15, then 15 points total are assigned: Absent Villalta Total Score: 0  LABS:  CBC     Component Value Date/Time   WBC 4.6 07/15/2018 0000   WBC 13.6 (H) 01/02/2010 0505   RBC 3.78 (L) 01/02/2010 0505   HGB 14.9 07/15/2018 0000   HCT 44 07/15/2018 0000   PLT 256 07/15/2018 0000   MCV 91.5 01/02/2010 0505   MCHC 34.5 01/02/2010 0505   RDW 12.7 01/02/2010 0505   Hepatic Function      Component Value Date/Time   AST 16 07/15/2018 0000   ALT 18 07/15/2018 0000   ALKPHOS 54 07/15/2018 0000   Renal Function   Lab Results  Component Value Date   CREATININE 0.8 07/15/2018   CrCl cannot be calculated (Patient's most recent lab result is older than the maximum 21 days allowed.).   Patient had labs drawn at PCP visit 10/28/22: eGFR 90, LFTs wnl  VVS Vascular Lab Studies:  10/22/22 VAS [place] LOWER EXTREMITY VENOUS (DVT)RIGHT  Summary:  RIGHT:  - Findings consistent with acute deep vein thrombosis involving the right  posterior tibial veins.  - Findings consistent with acute intramuscular deep vein thrombosis  involving the soleal vein.  - Findings consistent with acute superficial vein thrombosis involving the  right great saphenous vein at the mid calf.  - No cystic structure found in the popliteal fossa.   ASSESSMENT: Location of DVT: Left superficial vein, Left distal vein Cause of DVT: provoked by a transient risk factor  PLAN: -Continue apixaban (Eliquis) 5 mg twice daily. -Expected duration of therapy: 3 months. Therapy started on 10/23/22. -Patient educated on purpose, proper use and potential adverse effects of  apixaban (Eliquis). -Discussed importance of taking medication around the same time every day. -Advised patient of medications to avoid (NSAIDs, aspirin doses >100 mg daily). -Educated that Tylenol (acetaminophen) is the preferred analgesic to lower the risk of bleeding. -Advised patient to alert all providers of anticoagulation therapy prior to starting a new medication or having a procedure. -Emphasized importance of monitoring for signs and symptoms of bleeding (abnormal bruising, prolonged bleeding, nose bleeds, bleeding from gums, discolored urine, black tarry stools). -Educated patient to present to the ED if emergent signs and symptoms of new thrombosis occur. -Counseled patient to wear compression stockings daily, removing at night.  Follow up: Next visit in DVT Clinic in February.   Rebbeca Paul, PharmD, Para March, CPP Deep Vein Thrombosis Clinic Clinical Pharmacist Practitioner Office: [phone]

## 2022-11-25 NOTE — Patient Instructions (Signed)
-  Continue Eliquis 5 mg (1 tablet) twice daily to complete 3 months.  -Your refills have been sent to your [facility] on [address] and [address]. You will likely need to call the pharmacy to ask them to fill this when you start to run low on your current supply.  -It is important to take your medication around the same time every day.  -Avoid NSAIDs like ibuprofen (Advil, Motrin) and naproxen (Aleve) as well as aspirin doses over 100 mg daily. -Tylenol (acetaminophen) is the preferred over the counter pain medication to lower the risk of bleeding. -Be sure to alert all of your health care providers that you are taking an anticoagulant prior to starting a new medication or having a procedure. -Monitor for signs and symptoms of bleeding (abnormal bruising, prolonged bleeding, nose bleeds, bleeding from gums, discolored urine, black tarry stools). If you have fallen and hit your head OR if your bleeding is severe or not stopping, seek emergency care.  -Go to the emergency room if emergent signs and symptoms of new clot occur (new or worse swelling and pain in an arm or leg, shortness of breath, chest pain, fast or irregular heartbeats, lightheadedness, dizziness, fainting, coughing up blood) or if you experience a significant color change (pale or blue) in the extremity that has the DVT.  -We recommend you wear compression stockings (20-30 mmHg) as long as you are having swelling or pain. Be sure to purchase the correct size and take them off at night.   Your next visit is on February 6th at 10am.  [facility] [address] [place], [place], [place] [address] Enter the hospital through Entrance C and pull up to the [facility] Vascular Center entrance to the free valet parking.  Check in for your appointment at the Heart & Vascular Center.   If you have any questions or need to reschedule an appointment, please call [phone].  If you are having an emergency, call 911 or present to the nearest  emergency room.   What is a DVT?  -Deep vein thrombosis (DVT) is a condition in which a blood clot forms in a vein of the deep venous system which can occur in the lower leg, thigh, pelvis, arm, or neck. This condition is serious and can be life-threatening if the clot travels to the arteries of the lungs and causing a blockage (pulmonary embolism, PE). A DVT can also damage veins in the leg, which can lead to long-term venous disease, leg pain, swelling, discoloration, and ulcers or sores (post-thrombotic syndrome).  -Treatment may include taking an anticoagulant medication to prevent more clots from forming and the current clot from growing, wearing compression stockings, and/or surgical procedures to remove or dissolve the clot.

## 2022-11-28 ENCOUNTER — Telehealth (HOSPITAL_COMMUNITY): Payer: Self-pay | Admitting: Student-PharmD

## 2022-11-28 NOTE — Telephone Encounter (Signed)
Patient called and said she has missed a morning dose of Eliquis a couple of times recently and asked what to do in that situation. Counseled that if she remembers it close to the time of the dose she missed, to go ahead and take it like normal. But if it is getting close to the time of her next dose, to skip the one she missed, take the next dose like normal, and never double up on doses. She also said she had some tingling in her arms last night while sleeping. Says tingling would come and go. She wasn't sure if it was related to her sleeping position as she is not experiencing it today. She asks if this is a cause for concern in light of her missed doses. She confirmed she is not having pain in either arm, chest pain, SOB. No tingling or pain in her legs. Would not expect missing a couple intermittent doses of Eliquis to cause tingling in her arms. DVT was in right leg and is asymptomatic. Counseled on importance of adherence to Eliquis. Recommended that if tingling recurs or is persistent to speak with her PCP about possible causes.

## 2022-12-17 ENCOUNTER — Encounter: Payer: Self-pay | Admitting: Gastroenterology

## 2023-01-20 ENCOUNTER — Ambulatory Visit (HOSPITAL_COMMUNITY)
Admission: RE | Admit: 2023-01-20 | Discharge: 2023-01-20 | Disposition: A | Discharge status: Home / Self Care | Payer: BC Managed Care – PPO | Source: Ambulatory Visit | Attending: Surgery | Admitting: Surgery

## 2023-01-20 DIAGNOSIS — I82441 Acute embolism and thrombosis of right tibial vein: Secondary | ICD-10-CM

## 2023-01-20 NOTE — Progress Notes (Signed)
DVT Clinic Note  Name: Karen Leon     MRN: [id number]     DOB: 08-Nov-1976     Sex: female  PCP: Michael Boston, MD  Today's Visit: Visit Information: Discharge Visit  Referred to DVT Clinic by: Dr. Rhona Raider ([facility])  Referred to CPP by: Dr. Trula Slade Reason for referral:  Chief Complaint  Patient presents with   Med Management - DVT   Virtual Visit via Telephone Note I connected with Karen Leon on 01/20/23 at  9:00 AM EST by telephone and verified that I am speaking with the correct person using two identifiers.  Location: Patient: at home Provider: in DVT Clinic at [facility]   I discussed the limitations, risks, security and privacy concerns of performing an evaluation and management service by telephone and the availability of in person appointments. I also discussed with the patient that there may be a patient responsible charge related to this service. The patient expressed understanding and agreed to proceed.  HISTORY OF PRESENT ILLNESS:  Karen Leon is a 47 y.o. female who presents for follow up medication management after diagnosis of DVT involving the R posterior tibial veins on 10/22/22 s/p R arthroscopic knee surgery the week prior. Also found to have soleal vein thrombosis and superficial vein thrombosis involving R GSV. She was started on Eliquis at that time with plan for three months of treatment. Last seen in DVT Clinic 11/25/22 at which time patient was completely asymptomatic. Given that, was ok with patient's request for virtual visit for today's discharge visit. Today patient reports she has remained asymptomatic. No pain, cramping, swelling. Denies abnormal bleeding or bruising. Reports occasional sporadic missed dose of Eliquis over the last couple months. Reports wearing compression stockings as needed.   Positive Thrombotic Risk Factors: Recent surgery (within 3 months) Bleeding Risk Factors: Anticoagulant therapy  Negative Thrombotic  Risk Factors: Previous VTE, Recent admission to hospital with acute illness (within 3 months), Recent trauma (within 3 months), Paralysis, paresis, or recent plaster cast immobilization of lower extremity, Pregnancy, Sedentary journey lasting >8 hours within 4 weeks, Central venous catheterization, Bed rest >72 hours within 3 months, Within 6 weeks postpartum, Recent cesarean section (within 3 months), Estrogen therapy, Testosterone therapy, Older age, Obesity, Smoking, Active cancer, Recent COVID diagnosis (within 3 months), Known thrombophilic condition, Non-malignant, chronic inflammatory condition, Erythropoiesis-stimulating agent  Rx Insurance Coverage: Commercial Rx Affordability: Eliquis copay is $0.   Past Medical History:  Diagnosis Date   Hyperlipidemia      Social History   Socioeconomic History   Marital status: Married    Spouse name: Not on file   Number of children: Not on file   Years of education: Not on file   Highest education level: Not on file  Occupational History   Not on file  Tobacco Use   Smoking status: Never   Smokeless tobacco: Never  Substance and Sexual Activity   Alcohol use: Yes    Alcohol/week: 0.0 standard drinks of alcohol   Drug use: No   Sexual activity: Yes    Partners: Male    Comment: IUD/Vasectomy  Other Topics Concern   Not on file  Social History Narrative   Not on file   Social Determinants of Health   Financial Resource Strain: Not on file  Food Insecurity: Not on file  Transportation Needs: Not on file  Physical Activity: Not on file  Stress: Not on file  Social Connections: Not on file  Intimate Partner Violence: Not  on file    Family History  Problem Relation Age of Onset   Hyperlipidemia Mother    Hypertension Mother    Hypertension Father    Hyperlipidemia Father    Cancer Father    Cancer Maternal Aunt    Cancer Maternal Grandfather     Allergies as of 01/20/2023   (No Known Allergies)    Current Outpatient  Medications on File Prior to Encounter  Medication Sig Dispense Refill   apixaban (ELIQUIS) 5 MG TABS tablet Take 1 tablet (5 mg total) by mouth 2 (two) times daily. Start taking after completion of starter pack. 60 tablet 1   No current facility-administered medications on file prior to encounter.   REVIEW OF SYSTEMS:  Review of Systems  Respiratory:  Negative for shortness of breath.   Cardiovascular:  Negative for chest pain, palpitations and leg swelling.  Musculoskeletal:  Negative for joint pain and myalgias.  Neurological:  Negative for dizziness and tingling.   PHYSICAL EXAMINATION: Unable to be completed due to virtual nature of visit.   Villalta Score for Post-Thrombotic Syndrome: Pain: Absent Cramps: Absent Heaviness: Absent Paresthesia: Absent Pruritus: Absent Pretibial Edema: Absent Skin Induration: Absent Hyperpigmentation: Absent Redness: Absent Venous Ectasia: Absent Pain on calf compression: Absent Villalta Preliminary Score: 0 Is venous ulcer present?: No If venous ulcer is present and score is <15, then 15 points total are assigned: Absent Villalta Total Score: 0  LABS:  CBC     Component Value Date/Time   WBC 4.6 07/15/2018 0000   WBC 13.6 (H) 01/02/2010 0505   RBC 3.78 (L) 01/02/2010 0505   HGB 14.9 07/15/2018 0000   HCT 44 07/15/2018 0000   PLT 256 07/15/2018 0000   MCV 91.5 01/02/2010 0505   MCHC 34.5 01/02/2010 0505   RDW 12.7 01/02/2010 0505   Hepatic Function      Component Value Date/Time   AST 16 07/15/2018 0000   ALT 18 07/15/2018 0000   ALKPHOS 54 07/15/2018 0000   Renal Function   Lab Results  Component Value Date   CREATININE 0.8 07/15/2018   CrCl cannot be calculated (Patient's most recent lab result is older than the maximum 21 days allowed.).   Patient had labs drawn at PCP visit 10/28/22: eGFR 90, LFTs wnl   VVS Vascular Lab Studies:  10/22/22 VAS [place] LOWER EXTREMITY VENOUS (DVT) RIGHT  Summary:  RIGHT:  - Findings  consistent with acute deep vein thrombosis involving the right  posterior tibial veins.  - Findings consistent with acute intramuscular deep vein thrombosis  involving the soleal vein.  - Findings consistent with acute superficial vein thrombosis involving the  right great saphenous vein at the mid calf.  - No cystic structure found in the popliteal fossa.   ASSESSMENT: Location of DVT: Right distal vein Cause of DVT: provoked by a transient risk factor   Patient is asymptomatic s/p 3 months of anticoagulation with Eliquis for provoked DVT. Provoking risk factor (surgery) no longer present. Villalta Score for Post-thrombotic Syndrome is 0.   PLAN: -Patient is discharged from the DVT Clinic. -Discontinue anticoagulation with Eliquis, as patient has completed 3 months of treatment for provoked DVT.  -Counseled patient on reducing risk of DVT in the future and to communicate with all providers about history of DVT following surgery.   Follow up: with PCP as otherwise planned.   I discussed the assessment and treatment plan with the patient. The patient was provided an opportunity to ask questions and all were  answered. The patient agreed with the plan and demonstrated an understanding of the instructions. The patient was advised to call back if any questions come up or seek an in-person evaluation with PCP/ortho if symptoms of DVT recur in the future.   I provided 16 minutes of non-face-to-face time during this encounter.  Rebbeca Paul, PharmD, Para March, CPP Deep Vein Thrombosis Clinic Clinical Pharmacist Practitioner Office: [phone]

## 2023-01-20 NOTE — Patient Instructions (Signed)
You have been discharged from the DVT Clinic! No further follow up in the DVT Clinic is needed.  -Discontinue Eliquis, as you have completed 3 months of treatment for a provoked DVT.  -Please reach out at [phone] if any questions arise.

## 2023-01-28 DIAGNOSIS — L700 Acne vulgaris: Secondary | ICD-10-CM | POA: Diagnosis not present

## 2023-02-03 ENCOUNTER — Ambulatory Visit (INDEPENDENT_AMBULATORY_CARE_PROVIDER_SITE_OTHER): Payer: BC Managed Care – PPO | Admitting: Gastroenterology

## 2023-02-03 ENCOUNTER — Encounter: Payer: Self-pay | Admitting: Gastroenterology

## 2023-02-03 ENCOUNTER — Encounter: Payer: Self-pay | Admitting: *Deleted

## 2023-02-03 VITALS — BP 100/60 | HR 70 | Ht 66.0 in | Wt 130.0 lb

## 2023-02-03 DIAGNOSIS — Z1211 Encounter for screening for malignant neoplasm of colon: Secondary | ICD-10-CM | POA: Insufficient documentation

## 2023-02-03 MED ORDER — NA SULFATE-K SULFATE-MG SULF 17.5-3.13-1.6 GM/177ML PO SOLN: 1.0000 | Freq: Once | ORAL | 0 refills | Status: AC

## 2023-02-03 NOTE — Patient Instructions (Signed)
_______________________________________________________  If your blood pressure at your visit was 140/90 or greater, please contact your primary care physician to follow up on this.  _______________________________________________________  If you are age 46 or older, your body mass index should be between 23-30. Your Body mass index is 20.98 kg/m. If this is out of the aforementioned range listed, please consider follow up with your Primary Care Provider.  If you are age 30 or younger, your body mass index should be between 19-25. Your Body mass index is 20.98 kg/m. If this is out of the aformentioned range listed, please consider follow up with your Primary Care Provider.   ________________________________________________________  The [place] [facility] providers would like to encourage you to use [facility] to communicate with providers for non-urgent requests or questions.  Due to long hold times on the telephone, sending your provider a message by [facility] may be a faster and more efficient way to get a response.  Please allow 48 business hours for a response.  Please remember that this is for non-urgent requests.  _______________________________________________________  Karen Leon have been scheduled for a colonoscopy. Please follow written instructions given to you at your visit today.  Please pick up your prep supplies at the pharmacy within the next 1-3 days. If you use inhalers (even only as needed), please bring them with you on the day of your procedure.  Start Benefiber or 2 teaspoons in 8 ounces of liquid daily and may increase to twice daily if tolerated.

## 2023-02-03 NOTE — Progress Notes (Signed)
     02/03/2023 Karen Leon [id number] [birth year]   HISTORY OF PRESENT ILLNESS:  This is a 47 year old female who is new to our office.  She has been referred by Dr. Jacalyn Lefevre to discuss and schedule a screening colonoscopy.  She has never had a colonoscopy in the past.  She was on Eliquis for 3 months due to a provoked DVT after a meniscus repair surgery.  She just completed on 01/19/2023, however.  From a GI standpoint she really has no complaints.  She says that she eats a lot of fiber so her bowel movements are sometimes are on the softer or looser side of things and she has urgency.  Not diarrhea, however.  No rectal bleeding.  No other complaints.  Family history of colon cancer in maternal grandfather.  Her father had esophageal cancer diagnosed at age 67 and passed away in 02-26-2019.  Past Medical History:  Diagnosis Date   History of DVT (deep vein thrombosis)    Hyperlipidemia    Thyroid nodule    Past Surgical History:  Procedure Laterality Date   KNEE SURGERY     Right   WISDOM TOOTH EXTRACTION      reports that she has never smoked. She has never used smokeless tobacco. She reports current alcohol use. She reports that she does not use drugs. family history includes Breast cancer in her maternal aunt; Cancer in her maternal aunt and maternal grandfather; Colon cancer in her maternal grandfather; Esophageal cancer in her father; Hyperlipidemia in her father and mother; Hypertension in her father and mother. No Known Allergies    Outpatient Encounter Medications as of 02/03/2023  Medication Sig   spironolactone (ALDACTONE) 50 MG tablet Take 50 mg by mouth daily.   tretinoin (RETIN-A) 0.05 % cream Apply 1 Dose topically.   [DISCONTINUED] apixaban (ELIQUIS) 5 MG TABS tablet Take 5 mg by mouth 2 (two) times daily.   No facility-administered encounter medications on file as of 02/03/2023.     REVIEW OF SYSTEMS  : All other systems reviewed and negative except where noted in the  History of Present Illness.   PHYSICAL EXAM: BP 100/60   Pulse 70   Ht 5' 6"$  (1.676 m)   Wt 130 lb (59 kg)   BMI 20.98 kg/m  General: Well developed white female in no acute distress Head: Normocephalic and atraumatic Eyes:  Sclerae anicteric, conjunctiva pink. Ears: Normal auditory acuity Lungs: Clear throughout to auscultation; no W/R/R. Heart: Regular rate and rhythm; no M/R/G. Abdomen: Soft, non-distended.  BS present.  Non-tender. Rectal:  Will be done at the time of colonoscopy. Musculoskeletal: Symmetrical with no gross deformities  Skin: No lesions on visible extremities Extremities: No edema  Neurological: Alert oriented x 4, grossly non-focal Psychological:  Alert and cooperative. Normal mood and affect  ASSESSMENT AND PLAN: *CRC screening:  Never had colonoscopy in the past.  Will schedule with Dr. Candis Schatz.  The risks, benefits, and alternatives to colonoscopy were discussed with the patient and she consents to proceed.  Also recommended Benefiber 2 tsp mixed with 8 ounces of liquid daily to help with bulking stool and maybe help with urgency.  Could consider pelvic floor PT as well if she desires referral.   CC:  Wile, Jesse Sans, MD

## 2023-02-04 NOTE — Progress Notes (Signed)
Agree with the assessment and plan as outlined by Alonza Bogus, PA-C.  Rhiley Tarver E. Candis Schatz, MD  [facility]

## 2023-03-03 ENCOUNTER — Encounter: Payer: BC Managed Care – PPO | Admitting: Gastroenterology

## 2023-03-19 DIAGNOSIS — E041 Nontoxic single thyroid nodule: Secondary | ICD-10-CM | POA: Insufficient documentation

## 2023-03-26 ENCOUNTER — Ambulatory Visit (AMBULATORY_SURGERY_CENTER): Payer: BC Managed Care – PPO | Admitting: *Deleted

## 2023-03-26 VITALS — Ht 66.0 in | Wt 130.0 lb

## 2023-03-26 DIAGNOSIS — Z1211 Encounter for screening for malignant neoplasm of colon: Secondary | ICD-10-CM

## 2023-03-26 NOTE — Progress Notes (Signed)
Pt's pre-visit is done over the phone and all paperwork (prep instructions) sent to patient. Pt's name and DOB verified at the beginning of the pre-visit. Pt denies any difficulty with ambulating.  No egg or soy allergy known to patient  No issues known to pt with past sedation with any surgeries or procedures Pt denies having issues being intubated Pt has no issues moving head neck or swallowing No FH of Malignant Hyperthermia Pt is not on diet pills Pt is not on home 02  Pt is not on blood thinners  Pt denies issues with constipation  Pt is not on dialysis Pt denies any upcoming cardiac testing Pt encouraged to use to use Singlecare or Goodrx to reduce cost  Patient's chart reviewed by Cathlyn Parsons CNRA prior to pre-visit and patient appropriate for the [facility].  Pre-visit completed and red dot placed by patient's name on their procedure day (on provider's schedule).  . Visit by phone Pt states  weight is  Instructions reviewed with pt and pt states understanding. Instructed to review again prior to procedure. Pt states they will.  Instructions sent by mail with coupon and by my chart Pt has Suprep at home

## 2023-04-03 ENCOUNTER — Encounter: Payer: Self-pay | Admitting: Gastroenterology

## 2023-04-17 ENCOUNTER — Encounter: Payer: Self-pay | Admitting: Gastroenterology

## 2023-04-17 ENCOUNTER — Ambulatory Visit (AMBULATORY_SURGERY_CENTER): Payer: BC Managed Care – PPO | Admitting: Gastroenterology

## 2023-04-17 VITALS — BP 106/68 | HR 68 | Temp 97.8°F | Resp 16 | Ht 66.0 in | Wt 127.4 lb

## 2023-04-17 DIAGNOSIS — D123 Benign neoplasm of transverse colon: Secondary | ICD-10-CM | POA: Diagnosis not present

## 2023-04-17 DIAGNOSIS — Z1211 Encounter for screening for malignant neoplasm of colon: Secondary | ICD-10-CM

## 2023-04-17 DIAGNOSIS — D125 Benign neoplasm of sigmoid colon: Secondary | ICD-10-CM | POA: Diagnosis not present

## 2023-04-17 DIAGNOSIS — D120 Benign neoplasm of cecum: Secondary | ICD-10-CM | POA: Diagnosis not present

## 2023-04-17 MED ORDER — SODIUM CHLORIDE 0.9 % IV SOLN: 500.0000 mL | Freq: Once | INTRAVENOUS | Status: AC

## 2023-04-17 NOTE — Progress Notes (Signed)
[facility] History and Physical   Primary Care Physician:  Melida Quitter, MD   Reason for Procedure:   Colon cancer screening  Plan:    Screening colonoscopy     HPI: Karen Leon is a 47 y.o. female undergoing initial average risk screening colonoscopy.  She has no family history of colon cancer and no chronic GI symptoms.    Past Medical History:  Diagnosis Date   History of DVT (deep vein thrombosis)    Hyperlipidemia    Thyroid nodule     Past Surgical History:  Procedure Laterality Date   KNEE SURGERY     Right   WISDOM TOOTH EXTRACTION      Prior to Admission medications   Medication Sig Start Date End Date Taking? Authorizing Provider  spironolactone (ALDACTONE) 50 MG tablet Take 50 mg by mouth daily. 01/28/23  Yes [provider]  tretinoin (RETIN-A) 0.05 % cream Apply 1 Dose topically. 01/28/23  Yes [provider]    Current Outpatient Medications  Medication Sig Dispense Refill   spironolactone (ALDACTONE) 50 MG tablet Take 50 mg by mouth daily.     tretinoin (RETIN-A) 0.05 % cream Apply 1 Dose topically.     Current Facility-Administered Medications  Medication Dose Route Frequency Provider Last Rate Last Admin   0.9 %  sodium chloride infusion  500 mL Intravenous Once Jenel Lucks, MD        Allergies as of 04/17/2023   (No Known Allergies)    Family History  Problem Relation Age of Onset   Hyperlipidemia Mother    Hypertension Mother    Hypertension Father    Hyperlipidemia Father    Esophageal cancer Father    Breast cancer Maternal Aunt    Cancer Maternal Aunt    Rectal cancer Maternal Grandfather    Cancer Maternal Grandfather    Colon cancer Maternal Grandfather    Colon polyps Neg Hx    Stomach cancer Neg Hx     Social History   Socioeconomic History   Marital status: Married    Spouse name: Not on file   Number of children: Not on file   Years of education: Not on file   Highest  education level: Not on file  Occupational History   Not on file  Tobacco Use   Smoking status: Never   Smokeless tobacco: Never  Vaping Use   Vaping Use: Never used  Substance and Sexual Activity   Alcohol use: Yes    Comment: occ   Drug use: No   Sexual activity: Yes    Partners: Male    Comment: IUD/Vasectomy  Other Topics Concern   Not on file  Social History Narrative   Not on file   Social Determinants of Health   Financial Resource Strain: Not on file  Food Insecurity: Not on file  Transportation Needs: Not on file  Physical Activity: Not on file  Stress: Not on file  Social Connections: Not on file  Intimate Partner Violence: Not on file    Review of Systems:  All other review of systems negative except as mentioned in the HPI.  Physical Exam: Vital signs BP (!) 86/61   Pulse 76   Temp 97.8 F (36.6 C) (Skin)   Ht 5\' 6"  (1.676 m)   Wt 127 lb 6.4 oz (57.8 kg)   SpO2 100%   BMI 20.56 kg/m   General:   Alert,  Well-developed, well-nourished, pleasant and cooperative in NAD Airway:  Mallampati 2 Lungs:  Clear throughout to auscultation.   Heart:  Regular rate and rhythm; no murmurs, clicks, rubs,  or gallops. Abdomen:  Soft, nontender and nondistended. Normal bowel sounds.   Neuro/Psych:  Normal mood and affect. A and O x 3   Koleen Celia E. Tomasa Rand, MD [facility]

## 2023-04-17 NOTE — Progress Notes (Signed)
Called to room to assist during endoscopic procedure.  Patient ID and intended procedure confirmed with present staff. Received instructions for my participation in the procedure from the performing physician.  

## 2023-04-17 NOTE — Patient Instructions (Signed)
YOU HAD AN ENDOSCOPIC PROCEDURE TODAY AT THE [facility]:   Refer to the procedure report that was given to you for any specific questions about what was found during the examination.  If the procedure report does not answer your questions, please call your gastroenterologist to clarify.  If you requested that your care partner not be given the details of your procedure findings, then the procedure report has been included in a sealed envelope for you to review at your convenience later.  YOU SHOULD EXPECT: Some feelings of bloating in the abdomen. Passage of more gas than usual.  Walking can help get rid of the air that was put into your GI tract during the procedure and reduce the bloating. If you had a lower endoscopy (such as a colonoscopy or flexible sigmoidoscopy) you may notice spotting of blood in your stool or on the toilet paper. If you underwent a bowel prep for your procedure, you may not have a normal bowel movement for a few days.  Please Note:  You might notice some irritation and congestion in your nose or some drainage.  This is from the oxygen used during your procedure.  There is no need for concern and it should clear up in a day or so.  SYMPTOMS TO REPORT IMMEDIATELY:  Following lower endoscopy (colonoscopy or flexible sigmoidoscopy):  Excessive amounts of blood in the stool  Significant tenderness or worsening of abdominal pains  Swelling of the abdomen that is new, acute  Fever of 100F or higher  For urgent or emergent issues, a gastroenterologist can be reached at any hour by calling [phone]. Do not use MyChart messaging for urgent concerns.    DIET:  We do recommend a small meal at first, but then you may proceed to your regular diet.  Drink plenty of fluids but you should avoid alcoholic beverages for 24 hours.  MEDICATIONS: Continue present medications.  FOLLOW UP: Await pathology results. Repeat colonoscopy (date to be determined once pathology  results are returned) is recommended for surveillance based on pathology results.  Please see handouts given to you by your recovery nurse: Polyps.  Thank you for allowing [place] to provide for your healthcare needs today,  ACTIVITY:  You should plan to take it easy for the rest of today and you should NOT DRIVE or use heavy machinery until tomorrow (because of the sedation medicines used during the test).    FOLLOW UP: Our staff will call the number listed on your records the next business day following your procedure.  We will call around 7:15- 8:00 am to check on you and address any questions or concerns that you may have regarding the information given to you following your procedure. If we do not reach you, we will leave a message.     If any biopsies were taken you will be contacted by phone or by letter within the next 1-3 weeks.  Please call us at [phone] if you have not heard about the biopsies in 3 weeks.    SIGNATURES/CONFIDENTIALITY: You and/or your care partner have signed paperwork which will be entered into your electronic medical record.  These signatures attest to the fact that that the information above on your After Visit Summary has been reviewed and is understood.  Full responsibility of the confidentiality of this discharge information lies with you and/or your care-partner.

## 2023-04-17 NOTE — Progress Notes (Signed)
Pt's states no medical or surgical changes since previsit or office visit. VS assessed by D.T 

## 2023-04-17 NOTE — Progress Notes (Signed)
Vss nad trans to pacu 

## 2023-04-17 NOTE — Op Note (Signed)
[facility] Patient Name: Karen Leon Procedure Date: 04/17/2023 8:40 AM MRN: [id number] Endoscopist: Lorin Picket E. Tomasa Rand , MD, [id number] Age: 47 Referring MD:  Date of Birth: [birth year] Gender: Female Account #: [id number] Procedure:                Colonoscopy Indications:              Screening for colorectal malignant neoplasm, This                            is the patient's first colonoscopy Medicines:                Monitored Anesthesia Care Procedure:                Pre-Anesthesia Assessment:                           - Prior to the procedure, a History and Physical                            was performed, and patient medications and                            allergies were reviewed. The patient's tolerance of                            previous anesthesia was also reviewed. The risks                            and benefits of the procedure and the sedation                            options and risks were discussed with the patient.                            All questions were answered, and informed consent                            was obtained. Prior Anticoagulants: The patient has                            taken no anticoagulant or antiplatelet agents. ASA                            Grade Assessment: II - A patient with mild systemic                            disease. After reviewing the risks and benefits,                            the patient was deemed in satisfactory condition to                            undergo the procedure.  After obtaining informed consent, the colonoscope                            was passed under direct vision. Throughout the                            procedure, the patient's blood pressure, pulse, and                            oxygen saturations were monitored continuously. The                            Olympus PCF-H190DL ([id number]) Colonoscope was                            introduced through  the anus and advanced to the the                            terminal ileum, with identification of the                            appendiceal orifice and IC valve. The colonoscopy                            was somewhat difficult due to a tortuous colon.                            Successful completion of the procedure was aided by                            using manual pressure. The patient tolerated the                            procedure well. The quality of the bowel                            preparation was adequate. The terminal ileum,                            ileocecal valve, appendiceal orifice, and rectum                            were photographed. The bowel preparation used was                            SUPREP via split dose instruction. Scope In: 8:52:43 AM Scope Out: 9:14:02 AM Scope Withdrawal Time: 0 hours 15 minutes 28 seconds  Total Procedure Duration: 0 hours 21 minutes 19 seconds  Findings:                 The perianal and digital rectal examinations were                            normal. Pertinent negatives include normal  sphincter tone and no palpable rectal lesions.                           A 3 mm polyp was found in the cecum. The polyp was                            sessile. The polyp was removed with a cold snare.                            Resection and retrieval were complete. Estimated                            blood loss was minimal.                           A 2 mm polyp was found in the transverse colon. The                            polyp was sessile. The polyp was removed with a                            cold snare. Resection and retrieval were complete.                            Estimated blood loss was minimal.                           A 3 mm polyp was found in the sigmoid colon. The                            polyp was sessile. The polyp was removed with a                            cold snare. Resection and  retrieval were complete.                            Estimated blood loss was minimal.                           The exam was otherwise normal throughout the                            examined colon.                           The terminal ileum appeared normal.                           The retroflexed view of the distal rectum and anal                            verge was normal and showed no anal or rectal  abnormalities. Complications:            No immediate complications. Estimated Blood Loss:     Estimated blood loss was minimal. Impression:               - One 3 mm polyp in the cecum, removed with a cold                            snare. Resected and retrieved.                           - One 2 mm polyp in the transverse colon, removed                            with a cold snare. Resected and retrieved.                           - One 3 mm polyp in the sigmoid colon, removed with                            a cold snare. Resected and retrieved.                           - The examined portion of the ileum was normal.                           - The distal rectum and anal verge are normal on                            retroflexion view. Recommendation:           - Patient has a contact number available for                            emergencies. The signs and symptoms of potential                            delayed complications were discussed with the                            patient. Return to normal activities tomorrow.                            Written discharge instructions were provided to the                            patient.                           - Resume previous diet.                           - Continue present medications.                           - Await pathology results.                           -  Repeat colonoscopy (date not yet determined) for                            surveillance based on pathology results. Antavius Sperbeck E.  Tomasa Rand, MD 04/17/2023 9:20:15 AM This report has been signed electronically.

## 2023-04-20 ENCOUNTER — Telehealth: Payer: Self-pay

## 2023-04-20 NOTE — Telephone Encounter (Signed)
  Follow up Call-     04/17/2023    8:00 AM  Call back number  Post procedure Call Back phone  # [phone]  Permission to leave phone message Yes     Patient questions:  Do you have a fever, pain , or abdominal swelling? No. Pain Score  0 *  Have you tolerated food without any problems? Yes.    Have you been able to return to your normal activities? Yes.    Do you have any questions about your discharge instructions: Diet   No. Medications  No. Follow up visit  No.  Do you have questions or concerns about your Care? No.  Actions: * If pain score is 4 or above: No action needed, pain <4.

## 2023-04-27 ENCOUNTER — Encounter: Payer: Self-pay | Admitting: Gastroenterology

## 2023-04-27 NOTE — Progress Notes (Signed)
Karen Leon,  Two of the three polyps which I removed during your recent procedure were proven to be completely benign but are considered "pre-cancerous" polyps that MAY have grown into cancer if they had not been removed.  Studies shows that at least 20% of women over age 46 and 30% of men over age 47 have pre-cancerous polyps.  The sigmoid polyp was not precancerous.  Based on current nationally recognized surveillance guidelines, I recommend that you have a repeat colonoscopy in 7 years.   If you develop any new rectal bleeding, abdominal pain or significant bowel habit changes, please contact me before then.

## 2023-07-15 DIAGNOSIS — Z30433 Encounter for removal and reinsertion of intrauterine contraceptive device: Secondary | ICD-10-CM | POA: Diagnosis not present

## 2023-07-15 DIAGNOSIS — Z124 Encounter for screening for malignant neoplasm of cervix: Secondary | ICD-10-CM | POA: Diagnosis not present

## 2023-07-15 DIAGNOSIS — Z01419 Encounter for gynecological examination (general) (routine) without abnormal findings: Secondary | ICD-10-CM | POA: Diagnosis not present

## 2023-08-14 DIAGNOSIS — Z30431 Encounter for routine checking of intrauterine contraceptive device: Secondary | ICD-10-CM | POA: Diagnosis not present

## 2023-08-26 DIAGNOSIS — H0012 Chalazion right lower eyelid: Secondary | ICD-10-CM | POA: Diagnosis not present

## 2023-11-02 DIAGNOSIS — E785 Hyperlipidemia, unspecified: Secondary | ICD-10-CM | POA: Diagnosis not present

## 2023-11-02 DIAGNOSIS — E041 Nontoxic single thyroid nodule: Secondary | ICD-10-CM | POA: Diagnosis not present

## 2023-11-04 DIAGNOSIS — Z0000 Encounter for general adult medical examination without abnormal findings: Secondary | ICD-10-CM | POA: Diagnosis not present

## 2023-11-04 DIAGNOSIS — E785 Hyperlipidemia, unspecified: Secondary | ICD-10-CM | POA: Diagnosis not present

## 2023-11-04 DIAGNOSIS — Z1331 Encounter for screening for depression: Secondary | ICD-10-CM | POA: Diagnosis not present

## 2023-11-04 DIAGNOSIS — Z1339 Encounter for screening examination for other mental health and behavioral disorders: Secondary | ICD-10-CM | POA: Diagnosis not present

## 2023-11-05 DIAGNOSIS — E785 Hyperlipidemia, unspecified: Secondary | ICD-10-CM | POA: Diagnosis not present

## 2023-12-01 DIAGNOSIS — E041 Nontoxic single thyroid nodule: Secondary | ICD-10-CM | POA: Diagnosis not present

## 2023-12-17 DIAGNOSIS — Z1231 Encounter for screening mammogram for malignant neoplasm of breast: Secondary | ICD-10-CM | POA: Diagnosis not present

## 2024-01-27 ENCOUNTER — Other Ambulatory Visit: Payer: Self-pay | Admitting: Medical Genetics

## 2024-02-04 ENCOUNTER — Other Ambulatory Visit (HOSPITAL_COMMUNITY): Payer: Self-pay

## 2024-02-09 ENCOUNTER — Other Ambulatory Visit (HOSPITAL_COMMUNITY)
Admission: RE | Admit: 2024-02-09 | Discharge: 2024-02-09 | Disposition: A | Discharge status: Home / Self Care | Payer: Self-pay | Source: Ambulatory Visit | Attending: Medical Genetics | Admitting: Medical Genetics

## 2024-02-21 LAB — GENECONNECT MOLECULAR SCREEN: Genetic Analysis Overall Interpretation: NEGATIVE

## 2024-11-18 DIAGNOSIS — E785 Hyperlipidemia, unspecified: Secondary | ICD-10-CM | POA: Diagnosis not present

## 2024-11-23 ENCOUNTER — Other Ambulatory Visit: Payer: Self-pay | Admitting: Internal Medicine

## 2024-11-23 DIAGNOSIS — Z0000 Encounter for general adult medical examination without abnormal findings: Secondary | ICD-10-CM | POA: Diagnosis not present

## 2024-11-23 DIAGNOSIS — Z1331 Encounter for screening for depression: Secondary | ICD-10-CM | POA: Diagnosis not present

## 2024-11-23 DIAGNOSIS — Z1339 Encounter for screening examination for other mental health and behavioral disorders: Secondary | ICD-10-CM | POA: Diagnosis not present

## 2024-11-23 DIAGNOSIS — E785 Hyperlipidemia, unspecified: Secondary | ICD-10-CM | POA: Diagnosis not present

## 2024-11-23 DIAGNOSIS — E041 Nontoxic single thyroid nodule: Secondary | ICD-10-CM

## 2024-12-19 ENCOUNTER — Ambulatory Visit
Admission: RE | Admit: 2024-12-19 | Discharge: 2024-12-19 | Disposition: A | Discharge status: Home / Self Care | Source: Ambulatory Visit | Attending: Internal Medicine | Admitting: Internal Medicine

## 2024-12-19 DIAGNOSIS — E041 Nontoxic single thyroid nodule: Secondary | ICD-10-CM
